# Patient Record
Sex: Male | Born: 1955 | Race: White | Hispanic: No | Smoking: Current every day smoker
Health system: Southern US, Community
[De-identification: ages and names within clinical notes are randomized; demographics above are authoritative.]

## PROBLEM LIST (undated history)

## (undated) DIAGNOSIS — C801 Malignant (primary) neoplasm, unspecified: Secondary | ICD-10-CM

## (undated) DIAGNOSIS — D494 Neoplasm of unspecified behavior of bladder: Secondary | ICD-10-CM

## (undated) DIAGNOSIS — M2550 Pain in unspecified joint: Secondary | ICD-10-CM

## (undated) DIAGNOSIS — D649 Anemia, unspecified: Secondary | ICD-10-CM

## (undated) DIAGNOSIS — IMO0002 Reserved for concepts with insufficient information to code with codable children: Secondary | ICD-10-CM

## (undated) DIAGNOSIS — D532 Scorbutic anemia: Secondary | ICD-10-CM

## (undated) DIAGNOSIS — I10 Essential (primary) hypertension: Secondary | ICD-10-CM

## (undated) DIAGNOSIS — L409 Psoriasis, unspecified: Secondary | ICD-10-CM

## (undated) DIAGNOSIS — Z5189 Encounter for other specified aftercare: Secondary | ICD-10-CM

## (undated) HISTORY — DX: Reserved for concepts with insufficient information to code with codable children: IMO0002

## (undated) HISTORY — DX: Anemia, unspecified: D64.9

## (undated) HISTORY — DX: Pain in unspecified joint: M25.50

## (undated) HISTORY — DX: Malignant (primary) neoplasm, unspecified: C80.1

## (undated) HISTORY — DX: Encounter for other specified aftercare: Z51.89

## (undated) HISTORY — DX: Neoplasm of unspecified behavior of bladder: D49.4

## (undated) HISTORY — DX: Scorbutic anemia: D53.2

## (undated) HISTORY — DX: Psoriasis, unspecified: L40.9

## (undated) HISTORY — DX: Essential (primary) hypertension: I10

---

## 2008-09-19 ENCOUNTER — Emergency Department: Payer: Self-pay | Admitting: Emergency Medicine

## 2008-10-15 ENCOUNTER — Emergency Department (HOSPITAL_COMMUNITY): Admission: EM | Admit: 2008-10-15 | Discharge: 2008-10-15 | Payer: Self-pay | Admitting: Emergency Medicine

## 2008-10-27 ENCOUNTER — Emergency Department: Payer: Self-pay | Admitting: Emergency Medicine

## 2008-11-05 ENCOUNTER — Ambulatory Visit: Payer: Self-pay | Admitting: General Practice

## 2008-11-07 ENCOUNTER — Emergency Department (HOSPITAL_COMMUNITY): Admission: EM | Admit: 2008-11-07 | Discharge: 2008-11-08 | Payer: Self-pay | Admitting: Emergency Medicine

## 2009-04-28 ENCOUNTER — Emergency Department (HOSPITAL_COMMUNITY): Admission: EM | Admit: 2009-04-28 | Discharge: 2009-04-28 | Payer: Self-pay | Admitting: Emergency Medicine

## 2009-12-10 ENCOUNTER — Emergency Department (HOSPITAL_COMMUNITY)
Admission: EM | Admit: 2009-12-10 | Discharge: 2009-12-10 | Payer: Self-pay | Source: Home / Self Care | Admitting: Family Medicine

## 2010-01-12 DIAGNOSIS — IMO0002 Reserved for concepts with insufficient information to code with codable children: Secondary | ICD-10-CM

## 2010-01-12 HISTORY — DX: Reserved for concepts with insufficient information to code with codable children: IMO0002

## 2010-01-20 ENCOUNTER — Emergency Department (HOSPITAL_COMMUNITY)
Admission: EM | Admit: 2010-01-20 | Discharge: 2010-01-20 | Disposition: A | Discharge status: Other Acute Inpatient Hospital | Payer: Self-pay | Source: Home / Self Care | Admitting: Family Medicine

## 2010-01-20 ENCOUNTER — Inpatient Hospital Stay (HOSPITAL_COMMUNITY)
Admission: EM | Admit: 2010-01-20 | Discharge: 2010-01-24 | Payer: Self-pay | Source: Home / Self Care | Attending: Internal Medicine | Admitting: Internal Medicine

## 2010-01-22 ENCOUNTER — Encounter: Payer: Self-pay | Admitting: Internal Medicine

## 2010-01-22 ENCOUNTER — Encounter (INDEPENDENT_AMBULATORY_CARE_PROVIDER_SITE_OTHER): Payer: Self-pay | Admitting: Internal Medicine

## 2010-01-26 ENCOUNTER — Encounter: Payer: Self-pay | Admitting: Internal Medicine

## 2010-01-27 LAB — TYPE AND SCREEN
ABO/RH(D): A NEG
Antibody Screen: NEGATIVE
Unit division: 0
Unit division: 0
Unit division: 0
Unit division: 0
Unit division: 0

## 2010-01-27 LAB — URINALYSIS, ROUTINE W REFLEX MICROSCOPIC
Bilirubin Urine: NEGATIVE
Hgb urine dipstick: NEGATIVE
Ketones, ur: NEGATIVE mg/dL
Nitrite: NEGATIVE
Protein, ur: NEGATIVE mg/dL
Specific Gravity, Urine: 1.013 (ref 1.005–1.030)
Urine Glucose, Fasting: NEGATIVE mg/dL
Urobilinogen, UA: 1 mg/dL (ref 0.0–1.0)
pH: 6 (ref 5.0–8.0)

## 2010-01-27 LAB — FOLATE: Folate: 11.4 ng/mL

## 2010-01-27 LAB — CBC
HCT: 17.6 % — ABNORMAL LOW (ref 39.0–52.0)
HCT: 21.4 % — ABNORMAL LOW (ref 39.0–52.0)
HCT: 28.8 % — ABNORMAL LOW (ref 39.0–52.0)
HCT: 32.1 % — ABNORMAL LOW (ref 39.0–52.0)
HCT: 35 % — ABNORMAL LOW (ref 39.0–52.0)
Hemoglobin: 4.7 g/dL — CL (ref 13.0–17.0)
Hemoglobin: 6.2 g/dL — CL (ref 13.0–17.0)
Hemoglobin: 8.6 g/dL — ABNORMAL LOW (ref 13.0–17.0)
Hemoglobin: 9.7 g/dL — ABNORMAL LOW (ref 13.0–17.0)
Hemoglobin: 10.4 g/dL — ABNORMAL LOW (ref 13.0–17.0)
MCH: 16.3 pg — ABNORMAL LOW (ref 26.0–34.0)
MCH: 19.4 pg — ABNORMAL LOW (ref 26.0–34.0)
MCH: 21 pg — ABNORMAL LOW (ref 26.0–34.0)
MCH: 21.7 pg — ABNORMAL LOW (ref 26.0–34.0)
MCH: 21.4 pg — ABNORMAL LOW (ref 26.0–34.0)
MCHC: 26.1 g/dL — ABNORMAL LOW (ref 30.0–36.0)
MCHC: 29 g/dL — ABNORMAL LOW (ref 30.0–36.0)
MCHC: 29.9 g/dL — ABNORMAL LOW (ref 30.0–36.0)
MCHC: 30.2 g/dL (ref 30.0–36.0)
MCHC: 29.7 g/dL — ABNORMAL LOW (ref 30.0–36.0)
MCV: 62.5 fL — ABNORMAL LOW (ref 78.0–100.0)
MCV: 67.1 fL — ABNORMAL LOW (ref 78.0–100.0)
MCV: 70.4 fL — ABNORMAL LOW (ref 78.0–100.0)
MCV: 71.8 fL — ABNORMAL LOW (ref 78.0–100.0)
MCV: 71.9 fL — ABNORMAL LOW (ref 78.0–100.0)
Platelets: 418 10*3/uL — ABNORMAL HIGH (ref 150–400)
Platelets: 357 10*3/uL (ref 150–400)
Platelets: 392 10*3/uL (ref 150–400)
Platelets: 351 10*3/uL (ref 150–400)
Platelets: 378 10*3/uL (ref 150–400)
RBC: 2.88 MIL/uL — ABNORMAL LOW (ref 4.22–5.81)
RBC: 3.19 MIL/uL — ABNORMAL LOW (ref 4.22–5.81)
RBC: 4.09 MIL/uL — ABNORMAL LOW (ref 4.22–5.81)
RBC: 4.47 MIL/uL (ref 4.22–5.81)
RBC: 4.87 MIL/uL (ref 4.22–5.81)
RDW: 21.1 % — ABNORMAL HIGH (ref 11.5–15.5)
RDW: 24.8 % — ABNORMAL HIGH (ref 11.5–15.5)
RDW: 25.5 % — ABNORMAL HIGH (ref 11.5–15.5)
RDW: 26.6 % — ABNORMAL HIGH (ref 11.5–15.5)
RDW: 26.7 % — ABNORMAL HIGH (ref 11.5–15.5)
WBC: 6.8 10*3/uL (ref 4.0–10.5)
WBC: 6.4 10*3/uL (ref 4.0–10.5)
WBC: 9.6 10*3/uL (ref 4.0–10.5)
WBC: 9.2 10*3/uL (ref 4.0–10.5)
WBC: 10.5 10*3/uL (ref 4.0–10.5)

## 2010-01-27 LAB — DIFFERENTIAL
Basophils Absolute: 0.1 10*3/uL (ref 0.0–0.1)
Basophils Absolute: 0.1 10*3/uL (ref 0.0–0.1)
Basophils Absolute: 0 10*3/uL (ref 0.0–0.1)
Basophils Relative: 1 % (ref 0–1)
Basophils Relative: 1 % (ref 0–1)
Basophils Relative: 0 % (ref 0–1)
Eosinophils Absolute: 0.1 10*3/uL (ref 0.0–0.7)
Eosinophils Absolute: 0.1 10*3/uL (ref 0.0–0.7)
Eosinophils Absolute: 0 10*3/uL (ref 0.0–0.7)
Eosinophils Relative: 2 % (ref 0–5)
Eosinophils Relative: 2 % (ref 0–5)
Eosinophils Relative: 0 % (ref 0–5)
Lymphocytes Relative: 24 % (ref 12–46)
Lymphocytes Relative: 31 % (ref 12–46)
Lymphocytes Relative: 7 % — ABNORMAL LOW (ref 12–46)
Lymphs Abs: 1.6 10*3/uL (ref 0.7–4.0)
Lymphs Abs: 2 10*3/uL (ref 0.7–4.0)
Lymphs Abs: 0.7 10*3/uL (ref 0.7–4.0)
Monocytes Absolute: 0.7 10*3/uL (ref 0.1–1.0)
Monocytes Absolute: 0.6 10*3/uL (ref 0.1–1.0)
Monocytes Absolute: 0.5 10*3/uL (ref 0.1–1.0)
Monocytes Relative: 11 % (ref 3–12)
Monocytes Relative: 10 % (ref 3–12)
Monocytes Relative: 5 % (ref 3–12)
Neutro Abs: 4.3 10*3/uL (ref 1.7–7.7)
Neutro Abs: 3.6 10*3/uL (ref 1.7–7.7)
Neutro Abs: 9.3 10*3/uL — ABNORMAL HIGH (ref 1.7–7.7)
Neutrophils Relative %: 62 % (ref 43–77)
Neutrophils Relative %: 56 % (ref 43–77)
Neutrophils Relative %: 88 % — ABNORMAL HIGH (ref 43–77)
nRBC: 2 /100 WBC — ABNORMAL HIGH

## 2010-01-27 LAB — POCT URINALYSIS DIPSTICK
Bilirubin Urine: NEGATIVE
Hgb urine dipstick: NEGATIVE
Ketones, ur: NEGATIVE mg/dL
Nitrite: NEGATIVE
Protein, ur: NEGATIVE mg/dL
Specific Gravity, Urine: 1.01 (ref 1.005–1.030)
Urine Glucose, Fasting: NEGATIVE mg/dL
Urobilinogen, UA: 1 mg/dL (ref 0.0–1.0)
pH: 5.5 (ref 5.0–8.0)

## 2010-01-27 LAB — POCT I-STAT, CHEM 8
BUN: 8 mg/dL (ref 6–23)
Calcium, Ion: 1.11 mmol/L — ABNORMAL LOW (ref 1.12–1.32)
Chloride: 103 mEq/L (ref 96–112)
Creatinine, Ser: 0.8 mg/dL (ref 0.4–1.5)
Glucose, Bld: 101 mg/dL — ABNORMAL HIGH (ref 70–99)
HCT: 21 % — ABNORMAL LOW (ref 39.0–52.0)
Hemoglobin: 7.1 g/dL — ABNORMAL LOW (ref 13.0–17.0)
Potassium: 4.3 mEq/L (ref 3.5–5.1)
Sodium: 139 mEq/L (ref 135–145)
TCO2: 27 mmol/L (ref 0–100)

## 2010-01-27 LAB — COMPREHENSIVE METABOLIC PANEL
ALT: 18 U/L (ref 0–53)
ALT: 17 U/L (ref 0–53)
AST: 18 U/L (ref 0–37)
AST: 16 U/L (ref 0–37)
Albumin: 3.4 g/dL — ABNORMAL LOW (ref 3.5–5.2)
Albumin: 3.7 g/dL (ref 3.5–5.2)
Alkaline Phosphatase: 61 U/L (ref 39–117)
Alkaline Phosphatase: 68 U/L (ref 39–117)
BUN: 7 mg/dL (ref 6–23)
BUN: 5 mg/dL — ABNORMAL LOW (ref 6–23)
CO2: 26 mEq/L (ref 19–32)
CO2: 28 mEq/L (ref 19–32)
Calcium: 8.7 mg/dL (ref 8.4–10.5)
Calcium: 9.3 mg/dL (ref 8.4–10.5)
Chloride: 106 mEq/L (ref 96–112)
Chloride: 102 mEq/L (ref 96–112)
Creatinine, Ser: 0.8 mg/dL (ref 0.4–1.5)
Creatinine, Ser: 0.84 mg/dL (ref 0.4–1.5)
GFR calc Af Amer: >60 mL/min (ref >60)
GFR calc Af Amer: >60 mL/min (ref >60)
GFR calc non Af Amer: >60 mL/min (ref >60)
GFR calc non Af Amer: >60 mL/min (ref >60)
Glucose, Bld: 99 mg/dL (ref 70–99)
Glucose, Bld: 161 mg/dL — ABNORMAL HIGH (ref 70–99)
Potassium: 4.6 mEq/L (ref 3.5–5.1)
Potassium: 4.8 mEq/L (ref 3.5–5.1)
Sodium: 140 mEq/L (ref 135–145)
Sodium: 140 mEq/L (ref 135–145)
Total Bilirubin: 0.8 mg/dL (ref 0.3–1.2)
Total Bilirubin: 1 mg/dL (ref 0.3–1.2)
Total Protein: 6.7 g/dL (ref 6.0–8.3)
Total Protein: 7.3 g/dL (ref 6.0–8.3)

## 2010-01-27 LAB — BASIC METABOLIC PANEL
BUN: 5 mg/dL — ABNORMAL LOW (ref 6–23)
CO2: 26 mEq/L (ref 19–32)
Calcium: 8.3 mg/dL — ABNORMAL LOW (ref 8.4–10.5)
Chloride: 104 mEq/L (ref 96–112)
Creatinine, Ser: 0.99 mg/dL (ref 0.4–1.5)
GFR calc Af Amer: >60 mL/min (ref >60)
GFR calc non Af Amer: >60 mL/min (ref >60)
Glucose, Bld: 157 mg/dL — ABNORMAL HIGH (ref 70–99)
Potassium: 4.3 mEq/L (ref 3.5–5.1)
Sodium: 136 mEq/L (ref 135–145)

## 2010-01-27 LAB — PREPARE RBC (CROSSMATCH)

## 2010-01-27 LAB — MAGNESIUM: Magnesium: 2.3 mg/dL (ref 1.5–2.5)

## 2010-01-27 LAB — VITAMIN B12: Vitamin B-12: 352 pg/mL (ref 211–911)

## 2010-01-27 LAB — PROTIME-INR
INR: 1.14 (ref 0.00–1.49)
Prothrombin Time: 14.8 seconds (ref 11.6–15.2)

## 2010-01-27 LAB — FERRITIN: Ferritin: 4 ng/mL — ABNORMAL LOW (ref 22–322)

## 2010-01-27 LAB — IRON AND TIBC
Iron: <10 ug/dL — ABNORMAL LOW (ref 42–135)
UIBC: >450 ug/dL

## 2010-01-27 LAB — URIC ACID: Uric Acid, Serum: 7.7 mg/dL (ref 4.0–7.8)

## 2010-01-27 LAB — TSH: TSH: 0.787 u[IU]/mL (ref 0.350–4.500)

## 2010-01-27 LAB — BRAIN NATRIURETIC PEPTIDE: Pro B Natriuretic peptide (BNP): 172 pg/mL — ABNORMAL HIGH (ref 0.0–100.0)

## 2010-01-27 LAB — ABO/RH: ABO/RH(D): A NEG

## 2010-01-27 LAB — APTT: aPTT: 36 seconds (ref 24–37)

## 2010-01-27 LAB — OCCULT BLOOD, POC DEVICE: Fecal Occult Bld: NEGATIVE

## 2010-02-10 ENCOUNTER — Emergency Department (HOSPITAL_COMMUNITY)
Admission: EM | Admit: 2010-02-10 | Discharge: 2010-02-10 | Payer: Self-pay | Source: Home / Self Care | Admitting: Family Medicine

## 2010-02-12 ENCOUNTER — Inpatient Hospital Stay (HOSPITAL_COMMUNITY)
Admission: RE | Admit: 2010-02-12 | Discharge: 2010-02-12 | Disposition: A | Discharge status: Home / Self Care | Payer: Self-pay | Source: Ambulatory Visit | Attending: Emergency Medicine | Admitting: Emergency Medicine

## 2010-02-13 NOTE — Letter (Signed)
Summary: Patient Notice- Polyp Results  [facility]  [address] [place], [place] [address]   Phone: [phone]  Fax: [phone]        January 26, 2010 MRN: [id number]    [facility] [address] [place], [place]  [address]    Dear Mr. FELDHAUS,  The polyp removed from your colon was adenomatous. This means that it was pre-cancerous or that  it had the potential to change into cancer over time.  I recommend that you have a repeat colonoscopy in 5 years to determine if you have developed any new polyps over time and screen for colorectal cancer. If you develop any new rectal bleeding, abdominal pain or significant bowel habit changes, please contact us before then.  In addition to repeating colonoscopy, changing health habits may reduce your risk of having more colon or rectal  polyps and possibly, colorectal cancer. You may lower your risk of future polyps and colorectal cancer by adopting healthy habits such as not smoking or using tobacco (if you do), being physically active, losing weight (if overweight), and eating a diet which includes fruits and vegetables and limits red meat.  Please call us if you are having persistent problems or have questions about your condition that have not been fully answered at this time.  Sincerely,  Iva Boop MD, [facility]  This letter has been electronically signed by your physician.  Appended Document: Patient Notice- Polyp Results letter mailed to patient's home

## 2010-02-13 NOTE — Procedures (Signed)
Summary: Upper Endoscopy  Patient: [name] Note: All result statuses are Final unless otherwise noted.  Tests: (1) Upper Endoscopy (EGD)   EGD Upper Endoscopy       DONE     [facility]     [address]     [place], [place]  [address]           ENDOSCOPY PROCEDURE REPORT           PATIENT:  [name], [name]  MR#:  [id number]     BIRTHDATE:  [birth year], 54 yrs. old  GENDER:  male           ENDOSCOPIST:  Iva Boop, MD, [facility]     Referred by:  [facility]           PROCEDURE DATE:  01/22/2010     PROCEDURE:  EGD, diagnostic 84696     ASA CLASS:  Class II     INDICATIONS:  anemia           MEDICATIONS:   Benadryl 50 mg IV, Versed 7.5 mg IV, Fentanyl 75     mcg IV     TOPICAL ANESTHETIC:  Cetacaine Spray           DESCRIPTION OF PROCEDURE:   After the risks benefits and     alternatives of the procedure were thoroughly explained, informed     consent was obtained.  The Pentax Gastroscope [id number] and     [id number] endoscope was introduced through the mouth and     advanced to the second portion of the duodenum, without     limitations.  The instrument was slowly withdrawn as the mucosa     was fully examined.     <<PROCEDUREIMAGES>>           The upper, middle, and distal third of the esophagus were     carefully inspected and no abnormalities were noted. The z-line     was well seen at the GEJ. The endoscope was pushed into the fundus     which was normal including a retroflexed view. The antrum,gastric     body, first and second part of the duodenum were unremarkable.     Retroflexed views revealed no abnormalities.    The scope was then     withdrawn from the patient and the procedure completed.           COMPLICATIONS:  None           ENDOSCOPIC IMPRESSION:     1) Normal EGD     RECOMMENDATIONS:     See colonoscopy. Use deep sedation in future when possible.           Iva Boop, MD, [facility]           CC:  [facility]           n.     eSIGNED:   Iva Boop at 01/22/2010 03:10 PM           Ned Grace, [id number]  Note: An exclamation mark (!) indicates a result that was not dispersed into the flowsheet. Document Creation Date: 01/22/2010 3:18 PM _______________________________________________________________________  (1) Order result status: Final Collection or observation date-time: 01/22/2010 14:51 Requested date-time:  Receipt date-time:  Reported date-time:  Referring Physician:   Ordering Physician: Stan Head [phone]) Specimen Source:  Source: Launa Grill Order Number: [id number] Lab site:

## 2010-02-13 NOTE — Procedures (Signed)
Summary: Colonoscopy  Patient: Josuha Fontanez Note: All result statuses are Final unless otherwise noted.  Tests: (1) Colonoscopy (COL)   COL Colonoscopy           DONE     [facility]     [address]     [place], [place]  [address]           COLONOSCOPY PROCEDURE REPORT           PATIENT:  [name], [name]  MR#:  [id number]     BIRTHDATE:  22-Mar-1955, 54 yrs. old  GENDER:  male     ENDOSCOPIST:  Iva Boop, MD, [facility]     REF. BY:  [facility],     PROCEDURE DATE:  01/22/2010     PROCEDURE:  Colonoscopy with snare polypectomy     ASA CLASS:  Class II     INDICATIONS:  Anemia microcytic, presumed iron deficient     MEDICATIONS:   There was residual sedation effect present from     prior procedure., Versed 7.5 mg IV, Fentanyl 50 mcg IV           DESCRIPTION OF PROCEDURE:   After the risks benefits and     alternatives of the procedure were thoroughly explained, informed     consent was obtained.  Digital rectal exam was performed and     revealed no abnormalities and normal prostate.   The Pentax     Colonoscope [id number] endoscope was introduced through the anus and     advanced to the cecum, which was identified by both the appendix     and ileocecal valve, limited by extreme patient discomfort at     times. Appendix seen using forceps to manipulate the cecum.   The     quality of the prep was good, using MoviPrep.  The instrument was     then slowly withdrawn as the colon was fully examined.Withdrawal:     9 minutes     <<PROCEDUREIMAGES>>           FINDINGS:  A diminutive polyp was found in the descending colon.     Polyp was snared without cautery. Retrieval was successful. snare     polyp  Moderate diverticulosis was found in the sigmoid colon.     This was otherwise a normal examination of the colon.   Retroflexed     views in the rectum revealed no abnormalities.    The scope was     then withdrawn from the patient and the procedure  completed.           COMPLICATIONS:  None     ENDOSCOPIC IMPRESSION:     1) Diminutive polyp in the descending colon - removed     2) Moderate diverticulosis in the sigmoid colon     3) Otherwise normal examination     RECOMMENDATIONS:     deep sedation in future     REPEAT EXAM:  In for Colonoscopy, pending biopsy results.           Iva Boop, MD, [facility]           n.     eSIGNED:   Iva Boop at 01/22/2010 03:17 PM           Ned Grace, [id number]  Note: An exclamation mark (!) indicates a result that was not dispersed into the flowsheet. Document Creation Date: 01/22/2010 3:18 PM _______________________________________________________________________  (1) Order result  status: Final Collection or observation date-time: 01/22/2010 15:10 Requested date-time:  Receipt date-time:  Reported date-time:  Referring Physician:   Ordering Physician: Stan Head [phone]) Specimen Source:  Source: Launa Grill Order Number: [id number] Lab site:

## 2010-03-13 HISTORY — PX: BLADDER TUMOR EXCISION: SHX238

## 2010-03-17 ENCOUNTER — Inpatient Hospital Stay (HOSPITAL_COMMUNITY): Payer: Self-pay

## 2010-03-17 ENCOUNTER — Emergency Department (HOSPITAL_COMMUNITY)
Admission: EM | Admit: 2010-03-17 | Discharge: 2010-03-17 | Disposition: A | Discharge status: Other Acute Inpatient Hospital | Payer: Self-pay | Attending: Emergency Medicine | Admitting: Emergency Medicine

## 2010-03-17 ENCOUNTER — Observation Stay (HOSPITAL_COMMUNITY)
Admission: EM | Admit: 2010-03-17 | Discharge: 2010-03-18 | Disposition: A | Discharge status: Home / Self Care | Payer: Self-pay | Source: Other Acute Inpatient Hospital | Attending: Urology | Admitting: Urology

## 2010-03-17 DIAGNOSIS — N329 Bladder disorder, unspecified: Secondary | ICD-10-CM | POA: Insufficient documentation

## 2010-03-17 DIAGNOSIS — R319 Hematuria, unspecified: Secondary | ICD-10-CM | POA: Insufficient documentation

## 2010-03-17 DIAGNOSIS — R5381 Other malaise: Secondary | ICD-10-CM | POA: Insufficient documentation

## 2010-03-17 DIAGNOSIS — I10 Essential (primary) hypertension: Secondary | ICD-10-CM | POA: Insufficient documentation

## 2010-03-17 DIAGNOSIS — K219 Gastro-esophageal reflux disease without esophagitis: Secondary | ICD-10-CM | POA: Insufficient documentation

## 2010-03-17 DIAGNOSIS — R5383 Other fatigue: Secondary | ICD-10-CM | POA: Insufficient documentation

## 2010-03-17 DIAGNOSIS — M549 Dorsalgia, unspecified: Secondary | ICD-10-CM | POA: Insufficient documentation

## 2010-03-17 DIAGNOSIS — Z862 Personal history of diseases of the blood and blood-forming organs and certain disorders involving the immune mechanism: Secondary | ICD-10-CM | POA: Insufficient documentation

## 2010-03-17 DIAGNOSIS — Z79899 Other long term (current) drug therapy: Secondary | ICD-10-CM | POA: Insufficient documentation

## 2010-03-17 DIAGNOSIS — D649 Anemia, unspecified: Secondary | ICD-10-CM | POA: Insufficient documentation

## 2010-03-17 DIAGNOSIS — G8929 Other chronic pain: Secondary | ICD-10-CM | POA: Insufficient documentation

## 2010-03-17 DIAGNOSIS — K7689 Other specified diseases of liver: Secondary | ICD-10-CM | POA: Insufficient documentation

## 2010-03-17 DIAGNOSIS — Z8639 Personal history of other endocrine, nutritional and metabolic disease: Secondary | ICD-10-CM | POA: Insufficient documentation

## 2010-03-17 LAB — URINE MICROSCOPIC-ADD ON

## 2010-03-17 LAB — DIFFERENTIAL
Basophils Absolute: 0.1 10*3/uL (ref 0.0–0.1)
Basophils Relative: 1 % (ref 0–1)
Eosinophils Absolute: 0.2 10*3/uL (ref 0.0–0.7)
Eosinophils Relative: 3 % (ref 0–5)
Lymphocytes Relative: 20 % (ref 12–46)
Lymphs Abs: 1.6 10*3/uL (ref 0.7–4.0)
Monocytes Absolute: 0.7 10*3/uL (ref 0.1–1.0)
Monocytes Relative: 8 % (ref 3–12)
Neutro Abs: 5.6 10*3/uL (ref 1.7–7.7)
Neutrophils Relative %: 68 % (ref 43–77)

## 2010-03-17 LAB — URINALYSIS, ROUTINE W REFLEX MICROSCOPIC
Bilirubin Urine: NEGATIVE
Glucose, UA: NEGATIVE mg/dL
Ketones, ur: NEGATIVE mg/dL
Nitrite: NEGATIVE
Protein, ur: 100 mg/dL — AB
Specific Gravity, Urine: 1.014 (ref 1.005–1.030)
Urobilinogen, UA: 0.2 mg/dL (ref 0.0–1.0)
pH: 5.5 (ref 5.0–8.0)

## 2010-03-17 LAB — HEMOGLOBIN AND HEMATOCRIT, BLOOD
HCT: 24.2 % — ABNORMAL LOW (ref 39.0–52.0)
Hemoglobin: 6.8 g/dL — CL (ref 13.0–17.0)

## 2010-03-17 LAB — POCT I-STAT, CHEM 8
BUN: 11 mg/dL (ref 6–23)
Calcium, Ion: 1.1 mmol/L — ABNORMAL LOW (ref 1.12–1.32)
Chloride: 97 mEq/L (ref 96–112)
Creatinine, Ser: 0.9 mg/dL (ref 0.4–1.5)
Glucose, Bld: 127 mg/dL — ABNORMAL HIGH (ref 70–99)
HCT: 29 % — ABNORMAL LOW (ref 39.0–52.0)
Hemoglobin: 9.9 g/dL — ABNORMAL LOW (ref 13.0–17.0)
Potassium: 3.6 mEq/L (ref 3.5–5.1)
Sodium: 135 mEq/L (ref 135–145)
TCO2: 28 mmol/L (ref 0–100)

## 2010-03-17 LAB — CBC
HCT: 25.8 % — ABNORMAL LOW (ref 39.0–52.0)
Hemoglobin: 7.1 g/dL — ABNORMAL LOW (ref 13.0–17.0)
MCH: 17.4 pg — ABNORMAL LOW (ref 26.0–34.0)
MCHC: 27.5 g/dL — ABNORMAL LOW (ref 30.0–36.0)
MCV: 63.1 fL — ABNORMAL LOW (ref 78.0–100.0)
Platelets: 434 10*3/uL — ABNORMAL HIGH (ref 150–400)
RBC: 4.09 MIL/uL — ABNORMAL LOW (ref 4.22–5.81)
RDW: 22.6 % — ABNORMAL HIGH (ref 11.5–15.5)
WBC: 8.2 10*3/uL (ref 4.0–10.5)

## 2010-03-17 LAB — PROTIME-INR
INR: 0.99 (ref 0.00–1.49)
Prothrombin Time: 13.3 seconds (ref 11.6–15.2)

## 2010-03-18 ENCOUNTER — Other Ambulatory Visit: Payer: Self-pay | Admitting: Urology

## 2010-03-18 ENCOUNTER — Encounter (HOSPITAL_COMMUNITY): Payer: Self-pay | Admitting: Radiology

## 2010-03-18 ENCOUNTER — Inpatient Hospital Stay (HOSPITAL_COMMUNITY): Payer: Self-pay

## 2010-03-18 LAB — CROSSMATCH
ABO/RH(D): A NEG
Antibody Screen: NEGATIVE
Unit division: 0
Unit division: 0
Unit division: 0

## 2010-03-18 LAB — HEMOGLOBIN AND HEMATOCRIT, BLOOD
HCT: 31.2 % — ABNORMAL LOW (ref 39.0–52.0)
Hemoglobin: 8.9 g/dL — ABNORMAL LOW (ref 13.0–17.0)

## 2010-03-18 LAB — PROTIME-INR
INR: 1.09 (ref 0.00–1.49)
Prothrombin Time: 14.3 seconds (ref 11.6–15.2)

## 2010-03-18 MED ORDER — IOHEXOL 300 MG/ML  SOLN
100.0000 mL | Freq: Once | INTRAMUSCULAR | Status: AC | PRN
  Administered 2010-03-18: 100 mL via INTRAVENOUS

## 2010-03-20 ENCOUNTER — Other Ambulatory Visit: Payer: Self-pay | Admitting: Urology

## 2010-03-20 ENCOUNTER — Observation Stay (HOSPITAL_COMMUNITY)
Admission: RE | Admit: 2010-03-20 | Discharge: 2010-03-21 | Disposition: A | Discharge status: Home / Self Care | Payer: Self-pay | Source: Ambulatory Visit | Attending: Urology | Admitting: Urology

## 2010-03-20 DIAGNOSIS — M109 Gout, unspecified: Secondary | ICD-10-CM | POA: Insufficient documentation

## 2010-03-20 DIAGNOSIS — F17200 Nicotine dependence, unspecified, uncomplicated: Secondary | ICD-10-CM | POA: Insufficient documentation

## 2010-03-20 DIAGNOSIS — I10 Essential (primary) hypertension: Secondary | ICD-10-CM | POA: Insufficient documentation

## 2010-03-20 DIAGNOSIS — M25429 Effusion, unspecified elbow: Secondary | ICD-10-CM | POA: Insufficient documentation

## 2010-03-20 DIAGNOSIS — Z23 Encounter for immunization: Secondary | ICD-10-CM | POA: Insufficient documentation

## 2010-03-20 DIAGNOSIS — C679 Malignant neoplasm of bladder, unspecified: Principal | ICD-10-CM | POA: Insufficient documentation

## 2010-03-20 DIAGNOSIS — K219 Gastro-esophageal reflux disease without esophagitis: Secondary | ICD-10-CM | POA: Insufficient documentation

## 2010-03-20 DIAGNOSIS — D649 Anemia, unspecified: Secondary | ICD-10-CM | POA: Insufficient documentation

## 2010-03-20 LAB — CBC
HCT: 35 % — ABNORMAL LOW (ref 39.0–52.0)
MCH: 20.3 pg — ABNORMAL LOW (ref 26.0–34.0)
MCHC: 28.3 g/dL — ABNORMAL LOW (ref 30.0–36.0)
MCV: 71.9 fL — ABNORMAL LOW (ref 78.0–100.0)
Platelets: 370 10*3/uL (ref 150–400)
RDW: 26.7 % — ABNORMAL HIGH (ref 11.5–15.5)
WBC: 12.9 10*3/uL — ABNORMAL HIGH (ref 4.0–10.5)

## 2010-03-20 LAB — BASIC METABOLIC PANEL
BUN: 9 mg/dL (ref 6–23)
Creatinine, Ser: 0.96 mg/dL (ref 0.4–1.5)
GFR calc non Af Amer: >60 mL/min (ref >60)
Glucose, Bld: 153 mg/dL — ABNORMAL HIGH (ref 70–99)
Potassium: 4 mEq/L (ref 3.5–5.1)

## 2010-03-20 LAB — SURGICAL PCR SCREEN: Staphylococcus aureus: NEGATIVE

## 2010-03-20 NOTE — H&P (Signed)
[name], Castillo NO.:  [id number]  MEDICAL RECORD NO.:  [id number]           PATIENT TYPE:  E  LOCATION:  [facility]                         FACILITY:  [facility]  PHYSICIAN:  Vania Rea, M.D. DATE OF BIRTH:  1955-08-10  DATE OF ADMISSION:  03/17/2010 DATE OF DISCHARGE:                             HISTORY & PHYSICAL   PRIMARY CARE PHYSICIAN:  Unassigned.  CHIEF COMPLAINT:  Generalized weakness.  HISTORY OF PRESENT ILLNESS:  This is a 55 year old Caucasian gentleman who has no primary care physician, but was seen in this facility 2 months ago for severe microcytic anemia.  His hemoglobin at that time was 4.7 and he was complaining of severe shortness of breath.  The patient had been visiting the urgent care repeatedly for bloody urine, but was never found to have blood in his urine on any of his visits. However on the particular visit 2 months ago, he was found to be severely anemic, was admitted to the hospitalist service and had extensive evaluation including upper and lower endoscopy and no clear cause of blood loss was found.  He was found to be severely iron- deficient, but his urinalysis at that time was negative for blood. Upper and lower endoscopy was negative for any cause of acute bleeding. He did have a CT scan of his abdomen and pelvis with contrast, which did show some abnormalities in his bladder, which was considered to be possibly artifact, but was not followed up as an outpatient.  Since discharge, the patient has been relatively healthy, but for the past 2 days as once again been having gross hematuria including passing clots and complaining of weakness.  In the emergency room, the patient was found to have a hemoglobin of 7.1 and the hospitalist service was called to assist with management.  The patient denies any chest pains or shortness of breath.  He denies any nausea or vomiting.  He denies any passage of bloody or black stool.  He says  this intermittent hematuria. There is blood in his urine.  It has been going on and off for about the past 2 years.  He does have a chronic back pains related to an injury about 15 years ago.  There is no family history of urological cancers. He does have a sister who was been treated with leukemia.  He used to be a heavy drinker, used to drink as much as 18 bottles of beer per day, but for the past 2 years has cut down considerably at the insistence of his wife and now only drinks at most one beer per week.  He used to smoke two packs per day.  He has cut down to half to one pack per day.  PAST MEDICAL HISTORY: 1. Hypertension. 2. Anemia. 3. Gout. 4. 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 5. Questionable history of acid reflux.  MEDICATIONS: 1. HCTZ 25 mg daily. 2. Metoprolol 50 mg daily. 3. Allopurinol 100 mg daily. 4. Omeprazole one daily.  ALLERGIES:  No known drug allergies.  SOCIAL HISTORY:  Tobacco and alcohol use as noted above.  He is currently living with  his second wife.  Denies any history of illicit drug use.  FAMILY HISTORY:  Significant for mother who died at age 38 from a GI malignancy.  Father who died at age 18 from unknown causes.  Two sisters, one healthy and the others treated for leukemia.  REVIEW OF SYSTEMS:  Other than noted above unremarkable.  PHYSICAL EXAM:  GENERAL:  Healthy-looking middle-aged Caucasian gentleman sitting up in the stretcher. VITAL SIGNS:  Temperature is 97.9, pulse 92, respiration 20, blood pressure 153/66.  He is saturating 100% on room air. HEENT:  His pupils are round equal.  Mucous membranes pink.  Mucous membranes pale.  Anicteric.  No cervical lymphadenopathy.  No thyromegaly or carotid bruit.  No jugular venous distention. CHEST:  Clear to auscultation bilaterally. CARDIOVASCULAR:  He has a regular rhythm.  He had a 3/6 systolic murmur over the precordium. ABDOMEN:  Obese, soft, nontender.  No masses.  No organomegaly.   No ascites. EXTREMITIES:  Without edema.  Has 2+ dorsalis pedis pulses bilaterally. SKIN:  Warm and dry.  There are no ulcerations.  He does have telangiectasis of his cheeks.  He has no spider nevi.  No stigmata of liver disease. CENTRAL NERVOUS SYSTEM:  Cranial nerves II-XII are grossly intact.  He has no focal lateralizing signs.  LABS:  His white count is 8.2, hemoglobin 7.1, MCV 63.1, platelets 433. His differential is normal.  Smear shows polychromasia and elliptocytes. His i-STAT chemistry shows a sodium of 135, potassium 3.6, chloride 97, glucose 127, BUN 11, creatinine 0.9.  His urinalysis shows cloudy urine, large amount of blood, nitrite negative, leukocyte esterase trace, protein greater than 100 in his urine is grossly bloody.  His PT and INR completely normal liver functions are not done on this visit, but 2 months ago his liver functions were completely normal.  His serum serotonin on his last visit was extremely low for no imaging studies have been done today.  ASSESSMENT: 1. Severe anemia likely due to gross and microscopic hematuria. 2. Questionable bladder tumor when reviewing CT scan of June 24, 2010. 3. Hypertension. 4. Gout. 5. Chronic back pain. 6. Questionable history of gastroesophageal reflux disease.  PLAN: 1. I had discussed this patient with Dr. Patsi Sears and I think it     would be this gentleman's best interest to have him admitted to the     urologist service and transferred to the [facility] for     further evaluation by the neurology team.  If after complete     urological evaluation, the patient is found to have no primary     urologic problems.  The patient can be referred back to internal     medicine service for further care. 2. The patient will be transfused with a total of 3 units of packed     red cells and will be started on iron supplements.  We will also     give him nicotine patch for nicotine replacement.  We will  continue     Protonix and     allopurinol. 3. We will continue HCTZ and metoprolol for his blood pressure. 4. Other plans as per orders.     Vania Rea, M.D.     LC/MEDQ  D:  03/17/2010  T:  03/17/2010  Job:  [id number]  cc:   Lynelle Smoke I. Patsi Sears, M.D.  Electronically Signed by Vania Rea M.D. on 03/20/2010 02:08:46 AM

## 2010-03-21 NOTE — Consult Note (Signed)
[name], UMHOLTZ                ACCOUNT NO.:  [id number]  MEDICAL RECORD NO.:  [id number]           PATIENT TYPE:  I  LOCATION:  1436                         FACILITY:  [facility]  PHYSICIAN:  Talmage Nap, MD  DATE OF BIRTH:  12/17/1955  DATE OF CONSULTATION:  03/21/2010 DATE OF DISCHARGE:                                CONSULTATION   CONSULTATION REQUESTED BY:  Sigmund I. Patsi Sears, MD  PRIMARY CARE PHYSICIAN:  Unassigned.  INDICATION FOR CONSULTATION:  Right elbow swelling.  HISTORY OF PRESENT ILLNESS:  The patient is a 55 year old Caucasian male with history of gout, on allopurinol, and hypertension, who initially presented to the hospital with gross hematuria with severe anemia. Subsequently, the patient had cystourethroscopy done with transurethral resection of the bladder tumor on March 20, 2010.  Postprocedure the patient was observed to have developed pain and swelling in the right elbow with associated difficulty in moving the right elbow.  He denied any associated systemic symptoms, i.e., no fever, no chills, no rigor, and swelling was said to be getting progressively worse with subsequent decreased range of movement, and I was thereafter consulted to evaluate the patient.  At the time the patient was seen by me, he denied any systemic symptoms, however, complained about intense pain at the right elbow with extreme difficulty in movement.  After evaluation, the patient was asked to take colchicine and tramadol stat doses and subsequently to continue with colchicine at home.  PAST MEDICAL HISTORY: 1. Hypertension. 2. Anemia. 3. Gout. 4. Chronic pain. 5. GERD.  PAST SURGICAL HISTORY:  Cystourethroscopy with transurethral resection of the bladder tumor secondary to gross hematuria and severe anemia.  PREADMISSION MEDICATIONS:  Include hydrochlorothiazide 25 mg p.o. daily, metoprolol 50 mg p.o. daily, allopurinol 100 mg p.o. daily, and omeprazole 1 tablet p.o.  daily.  ALLERGIES:  No known allergies.  SOCIAL HISTORY:  Positive for alcohol and  tobacco use.  The patient denies any history of illicit drug use.  FAMILY HISTORY:  York Spaniel to be positive for GI malignancy as well as leukemia.  REVIEW OF SYSTEMS:  The patient denies any history of headaches.  No blurry vision.  No nausea or vomiting.  No fever, no chills, no rigor. No chest pain or shortness of breath.  No cough.  No abdominal discomfort.  No diarrhea or hematochezia.  No dysuria or hematuria.  No swelling of the lower extremity.  He complained about pain and redness in the right elbow with difficulty in moving the right elbow.  PHYSICAL EXAMINATION:  GENERAL:  A middle-aged man, not in any acute respiratory distress, well-hydrated.  PRESENT VITAL SIGNS:  Blood pressure is 118/70, temperature is 97.9, pulse is 81, respiratory rate 20.  HEENT:  Pallor but pupils are reactive to light and extraocular muscles are intact.  NECK:  No jugular venous distention.  No carotid bruit.  No lymphadenopathy.  CHEST:  Clear to auscultation.  HEART: Sounds are 1 and 2.  ABDOMEN:  Soft, nontender.  Liver, spleen tip not palpable.  Bowel sounds are positive.  EXTREMITIES:  No pedal edema. NEUROLOGIC:  Exam is nonfocal.  MUSCULOSKELETAL:  Erythema and tenderness right elbow with difficulty, with decreased range of movement.  LABORATORY DATA:  A basic metabolic panel done on March 20, 2010, showed a sodium of 138, potassium of 4.0, chloride of 100, with a bicarb of 31, glucose is 153, BUN is 9.  Complete blood count with no differential showed WBC of 12.9, hemoglobin 9.9, hematocrit 35.0, MCV of 71.9 with a platelet count of 370.  IMPRESSION: 1.Acute gouty arthritis, right elbow.  PLAN: a. The patient to have colchicine 0.6 mg p.o. stat as well as tramadol     50 mg p.o. stat and thereafter will continue with colchicine 0.6 mg     p.o. t.i.d. at home. 2. Gross hematuria/anemia, status post  cystourethroscopy with     transurethral resection of the bladder tumor.  Management     essentially as per Urology. 3. Hypertension, presently BP stable.  The patient is to continue     metoprolol, however, because of hyperuricemia, the patient may     consider discontinuing hydrochlorothiazide. 4. Anemia:  H and H fairly stable. 5. GERD:  The patient is to continue omeprazole. 6. Discharge of the patient is essentially the prerogative of the     urologist.  Thank you for allowing me to participate in the management of this patient.     Talmage Nap, MD     CN/MEDQ  D:  03/21/2010  T:  03/21/2010  Job:  [id number]  Electronically Signed by Talmage Nap  on 03/21/2010 04:13:31 PM

## 2010-03-31 NOTE — Consult Note (Signed)
[name], [name]                ACCOUNT NO.:  [id number]  MEDICAL RECORD NO.:  [id number]           PATIENT TYPE:  I  LOCATION:  1415                         FACILITY:  [facility]  PHYSICIAN:  Meara Wiechman I. Patsi Sears, M.D.DATE OF BIRTH:  [birth year]  DATE OF CONSULTATION:  03/17/2010 DATE OF DISCHARGE:                                CONSULTATION   SUBJECTIVE:  [name] is a 55 year old divorced white male __________ [place], who is admitted today for evaluation of gross painless hematuria.  This is his third visit to [facility] with similar complaints.  The patient was here approximately 3 months ago complaining of gross hematuria, but his urinalysis was negative at that time, the patient was discharged.  He returned with acute anemia, found to be microcytic, and was treated with 4 units transfusion.  He was admitted to [facility] at that time, with GI evaluation negative.  The patient feels that he was told that he had gastric ulcers, but reports of the chart do not support that.  The patient also was found to have dyspnea thought secondary to anemia, microcytic anemia, chronic low back pain, bursitis, gout, chronic tobacco use, and hypertension.  The patient does not work, has no income, has been unable to buy any medications.  Note, his past history as noted above with anemia, gout, and hypertension.  Tobacco:  One pack a day x40 years, equals 40 pack-year history.  The patient has been given nicotine patch, but cannot afford to buy the patch.  He is not smoking in the hospital, however.  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  Currently only occasional beer (could not afford).  ALLERGIES:  None known.  MEDICATIONS:  Over-the-counter medications only.  SOCIAL HISTORY:  The patient lives with his girlfriend.  FAMILY HISTORY:  Mother died age 81 from GI malignancy.  Father died age 5 of unknown cause.  One sister with leukemia and  one brother with motor vehicle accident and chronic problems.  The patient is currently trying to become eligible for disability.  REVIEW OF SYSTEMS:  Constitutional review of systems is significant for general weakness, gross hematuria.  No frequency, urgency, dysuria, however.  No chills, fever, flank pain.  No history of stones. Remaining constitutional review of systems is noncontributory.  PHYSICAL EXAMINATION:  GENERAL:  A well-developed, well-nourished white male, in no acute distress. VITAL SIGNS:  Temperature 98.3, pulse 63, blood pressure 162/71. ABDOMEN:  Soft.  Positive bowel sounds.  No organomegaly or masses. Flank is negative for CVA pain. GENITOURINARY:  Normal penis, normal scrotum, normal testicles descended bilaterally. RECTAL:  Normal exam by recent exam. NEUROLOGIC:  Physiologic.  ASSESSMENT:  Gross hematuria with acute bleed.  The patient has been evaluated previously by internal medicine with no diagnosis made, except for microcytic anemia.  The patient is admitted by the hospitalist and transferred to Urology Service.  He will have cytologies, and will need eventual cystoscopy.  He had a very poor cystogram phase of his CT before, and needs to have contrasted CT scan of cystogram.  We will obtain  this while he is in the hospital.     Robecca Fulgham I. Patsi Sears, M.D.     SIT/MEDQ  D:  03/17/2010  T:  03/18/2010  Job:  [id number]  cc:   Dr. Orvan Falconer  Electronically Signed by Jethro Bolus M.D. on 03/31/2010 12:35:09 PM

## 2010-03-31 NOTE — Op Note (Signed)
  NAMETYRAE, ALCOSER                ACCOUNT NO.:  [id number]  MEDICAL RECORD NO.:  [id number]           PATIENT TYPE:  I  LOCATION:  1436                         FACILITY:  [facility] ([facility])  PHYSICIAN:  Tobechukwu Emmick I. Patsi Sears, M.D.DATE OF BIRTH:  [birth year]  DATE OF PROCEDURE: DATE OF DISCHARGE:                              OPERATIVE REPORT   PREOPERATIVE DIAGNOSIS:  Gross hematuria with severe anemia.  POSTOPERATIVE DIAGNOSIS:  Left bladder wall bladder cancer.  OPERATIONS:  Cystourethroscopy and TUR bladder tumor.  ANESTHESIA:  General LMA.  OPERATION:  After appropriate preanesthesia, the patient is brought to the operating room, placed on the operating table in dorsal supine position where general LMA anesthesia was introduced.  He was then replaced in the dorsal lithotomy position where the pubis was prepped with Betadine solution and draped in usual fashion.  BRIEF HISTORY:  This 55 year old male has a history of gross painless hematuria.  He had 2 prior visits to [facility] Emergency Room with gross hematuria, was found to be profoundly anemic and received 4 units of transfusion.  Complete GI and cardiac evaluation was accomplished, but no diagnosis was made.  Upon the third visit to the Emergency Room with acute anemia, he was noted to have gross painless hematuria, urology was consulted.  The patient was transfused and stabilized, allowed to be discharged, and returns today for cystoscopy.  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  CT scan shows a mass in the bladder and he is now for cystoscopy, bladder biopsies, possible TURBT.  PROCEDURE IN DETAIL:  Cystourethroscopy was accomplished and shows the patient has a normal-appearing prostate and normal urethra.  There is no bleeding of the prostate.  Inspection of bladder reveals a normal right trigone, and the left lateral wall, there is a large  bladder cancer identified, measuring approximately 4 cm.  I could not find the left ureteral orifice.  Indigo carmine was given at the beginning of the procedure, but even at the end of procedure, no blue contrast is seen from either ureter orifice.  So, I felt that the patient is possibly dehydrated, he is given extra IV fluid, but no blue contrast was identified.  Resection was accomplished of the bladder tumor that was located at the 4 o'clock position in the anterior bladder wall and is completely resected.  No bleeding was noted.  The tumor base was cauterized.  The tissue was sent to laboratory for examination.  The patient had an 18 Foley catheter placed and was clear at the end of the procedure.  Catheter drained well.  The patient was awakened after being given a B&O suppository.  He was given IV Toradol and taken to recovery room in good condition.     Kendall Arnell I. Patsi Sears, M.D.     SIT/MEDQ  D:  03/20/2010  T:  03/20/2010  Job:  [id number]  Electronically Signed by Jethro Bolus M.D. on 03/31/2010 12:35:12 PM

## 2010-04-07 ENCOUNTER — Inpatient Hospital Stay (INDEPENDENT_AMBULATORY_CARE_PROVIDER_SITE_OTHER)
Admission: RE | Admit: 2010-04-07 | Discharge: 2010-04-07 | Disposition: A | Discharge status: Home / Self Care | Payer: Self-pay | Source: Ambulatory Visit | Attending: Emergency Medicine | Admitting: Emergency Medicine

## 2010-04-07 DIAGNOSIS — M7020 Olecranon bursitis, unspecified elbow: Secondary | ICD-10-CM

## 2010-04-07 LAB — POCT I-STAT, CHEM 8
BUN: 4 mg/dL — ABNORMAL LOW (ref 6–23)
Creatinine, Ser: 0.9 mg/dL (ref 0.4–1.5)
Glucose, Bld: 106 mg/dL — ABNORMAL HIGH (ref 70–99)
Sodium: 138 mEq/L (ref 135–145)
TCO2: 26 mmol/L (ref 0–100)

## 2010-06-27 ENCOUNTER — Ambulatory Visit (INDEPENDENT_AMBULATORY_CARE_PROVIDER_SITE_OTHER): Payer: Self-pay

## 2010-06-27 ENCOUNTER — Inpatient Hospital Stay (INDEPENDENT_AMBULATORY_CARE_PROVIDER_SITE_OTHER)
Admission: RE | Admit: 2010-06-27 | Discharge: 2010-06-27 | Disposition: A | Discharge status: Home / Self Care | Payer: Self-pay | Source: Ambulatory Visit | Attending: Family Medicine | Admitting: Family Medicine

## 2010-06-27 DIAGNOSIS — M779 Enthesopathy, unspecified: Secondary | ICD-10-CM

## 2010-11-18 ENCOUNTER — Other Ambulatory Visit: Payer: Self-pay | Admitting: Urology

## 2010-11-18 ENCOUNTER — Encounter (HOSPITAL_COMMUNITY): Payer: Self-pay | Admitting: Pharmacy Technician

## 2010-11-19 ENCOUNTER — Other Ambulatory Visit: Payer: Self-pay

## 2010-11-19 ENCOUNTER — Encounter (HOSPITAL_COMMUNITY): Payer: Self-pay

## 2010-11-19 ENCOUNTER — Ambulatory Visit (HOSPITAL_COMMUNITY)
Admission: RE | Admit: 2010-11-19 | Discharge: 2010-11-19 | Disposition: A | Discharge status: Home / Self Care | Payer: Self-pay | Source: Ambulatory Visit | Attending: Urology | Admitting: Urology

## 2010-11-19 DIAGNOSIS — I10 Essential (primary) hypertension: Secondary | ICD-10-CM | POA: Insufficient documentation

## 2010-11-19 DIAGNOSIS — Z01818 Encounter for other preprocedural examination: Secondary | ICD-10-CM | POA: Insufficient documentation

## 2010-11-19 DIAGNOSIS — F17200 Nicotine dependence, unspecified, uncomplicated: Secondary | ICD-10-CM | POA: Insufficient documentation

## 2010-11-19 DIAGNOSIS — Z01812 Encounter for preprocedural laboratory examination: Secondary | ICD-10-CM | POA: Insufficient documentation

## 2010-11-19 LAB — BASIC METABOLIC PANEL
BUN: 13 mg/dL (ref 6–23)
CO2: 28 mEq/L (ref 19–32)
Calcium: 9.8 mg/dL (ref 8.4–10.5)
Chloride: 100 mEq/L (ref 96–112)
Creatinine, Ser: 0.88 mg/dL (ref 0.50–1.35)

## 2010-11-19 LAB — CBC
HCT: 42.9 % (ref 39.0–52.0)
MCH: 23.3 pg — ABNORMAL LOW (ref 26.0–34.0)
MCV: 72.3 fL — ABNORMAL LOW (ref 78.0–100.0)
RBC: 5.93 MIL/uL — ABNORMAL HIGH (ref 4.22–5.81)
WBC: 9.6 10*3/uL (ref 4.0–10.5)

## 2010-11-19 NOTE — Patient Instructions (Signed)
20 [name]  11/19/2010   Your procedure is scheduled on: 11/27/10  Report to [facility] at 6:00 AM.  Call this number if you have problems the morning of surgery: [phone]   Remember:   Do not eat food:After Midnight.  Do not drink clear liquids: After Midnight.  Take these medicines the morning of surgery with A SIP OF WATER: METOPROLOL / ALLOPUIROL   Do not wear jewelry, make-up or nail polish.  Do not wear lotions, powders, or perfumes. You may wear deodorant.  Do not shave 48 hours prior to surgery.  Do not bring valuables to the hospital.  Contacts, dentures or bridgework may not be worn into surgery.  Leave suitcase in the car. After surgery it may be brought to your room.  For patients admitted to the hospital, checkout time is 11:00 AM the day of discharge.   Patients discharged the day of surgery will not be allowed to drive home.  Name and phone number of your driver:   Special Instructions: CHG Shower Use Special Wash: 1/2 bottle night before surgery and 1/2 bottle morning of surgery.   Please read over the following fact sheets that you were given: MRSA Information

## 2010-11-27 ENCOUNTER — Encounter (HOSPITAL_COMMUNITY): Payer: Self-pay | Admitting: *Deleted

## 2010-11-27 ENCOUNTER — Encounter (HOSPITAL_COMMUNITY)
Admission: RE | Disposition: A | Discharge status: Home / Self Care | Payer: Self-pay | Source: Ambulatory Visit | Attending: Urology

## 2010-11-27 ENCOUNTER — Encounter (HOSPITAL_COMMUNITY): Payer: Self-pay | Admitting: Anesthesiology

## 2010-11-27 ENCOUNTER — Ambulatory Visit (HOSPITAL_COMMUNITY)
Admission: RE | Admit: 2010-11-27 | Discharge: 2010-11-27 | Disposition: A | Discharge status: Home / Self Care | Payer: Self-pay | Source: Ambulatory Visit | Attending: Urology | Admitting: Urology

## 2010-11-27 ENCOUNTER — Other Ambulatory Visit: Payer: Self-pay | Admitting: Urology

## 2010-11-27 DIAGNOSIS — C679 Malignant neoplasm of bladder, unspecified: Secondary | ICD-10-CM | POA: Insufficient documentation

## 2010-11-27 DIAGNOSIS — D649 Anemia, unspecified: Secondary | ICD-10-CM | POA: Insufficient documentation

## 2010-11-27 DIAGNOSIS — I10 Essential (primary) hypertension: Secondary | ICD-10-CM | POA: Insufficient documentation

## 2010-11-27 HISTORY — PX: TRANSURETHRAL RESECTION OF BLADDER TUMOR: SHX2575

## 2010-11-27 SURGERY — TURBT (TRANSURETHRAL RESECTION OF BLADDER TUMOR)
Anesthesia: General | Site: Bladder | Wound class: Clean Contaminated

## 2010-11-27 MED ORDER — LACTATED RINGERS IV SOLN: INTRAVENOUS | Status: DC

## 2010-11-27 MED ORDER — CEFAZOLIN SODIUM-DEXTROSE 2-3 GM-% IV SOLR
2.0000 g | Freq: Once | INTRAVENOUS | Status: AC
  Administered 2010-11-27: 2 g via INTRAVENOUS
  Filled 2010-11-27: qty 50

## 2010-11-27 MED ORDER — KETOROLAC TROMETHAMINE 60 MG/2ML IM SOLN: INTRAMUSCULAR | Status: DC | PRN

## 2010-11-27 MED ORDER — KETOROLAC TROMETHAMINE 30 MG/ML IJ SOLN
INTRAMUSCULAR | Status: DC | PRN
  Administered 2010-11-27: 30 mg via INTRAVENOUS

## 2010-11-27 MED ORDER — URELLE 81 MG PO TABS: 1.0000 | ORAL_TABLET | Freq: Three times a day (TID) | ORAL | Status: DC

## 2010-11-27 MED ORDER — SODIUM CHLORIDE 0.9 % IR SOLN
Status: DC | PRN
  Administered 2010-11-27: 2000 mL

## 2010-11-27 MED ORDER — FENTANYL CITRATE 0.05 MG/ML IJ SOLN
INTRAMUSCULAR | Status: DC | PRN
  Administered 2010-11-27 (×2): 50 ug via INTRAVENOUS

## 2010-11-27 MED ORDER — MIDAZOLAM HCL 5 MG/5ML IJ SOLN
INTRAMUSCULAR | Status: DC | PRN
  Administered 2010-11-27: 2 mg via INTRAVENOUS

## 2010-11-27 MED ORDER — HYDROCODONE-ACETAMINOPHEN 7.5-650 MG PO TABS: 1.0000 | ORAL_TABLET | Freq: Four times a day (QID) | ORAL | Status: AC | PRN

## 2010-11-27 MED ORDER — LIDOCAINE HCL 1 % IJ SOLN
INTRAMUSCULAR | Status: DC | PRN
  Administered 2010-11-27: 100 mg via INTRADERMAL

## 2010-11-27 MED ORDER — ONDANSETRON HCL 4 MG/2ML IJ SOLN
INTRAMUSCULAR | Status: DC | PRN
  Administered 2010-11-27: 4 mg via INTRAVENOUS

## 2010-11-27 MED ORDER — LACTATED RINGERS IV SOLN
INTRAVENOUS | Status: DC | PRN
  Administered 2010-11-27 (×2): via INTRAVENOUS

## 2010-11-27 MED ORDER — STERILE WATER FOR IRRIGATION IR SOLN
Status: DC | PRN
  Administered 2010-11-27: 550 mL

## 2010-11-27 MED ORDER — PROPOFOL 10 MG/ML IV EMUL
INTRAVENOUS | Status: DC | PRN
  Administered 2010-11-27: 200 mg via INTRAVENOUS

## 2010-11-27 MED ORDER — BELLADONNA ALKALOIDS-OPIUM 16.2-60 MG RE SUPP
RECTAL | Status: AC
  Filled 2010-11-27: qty 1

## 2010-11-27 MED ORDER — CEFAZOLIN SODIUM 1-5 GM-% IV SOLN
INTRAVENOUS | Status: AC
  Filled 2010-11-27: qty 100

## 2010-11-27 MED ORDER — BELLADONNA ALKALOIDS-OPIUM 16.2-60 MG RE SUPP
RECTAL | Status: DC | PRN
  Administered 2010-11-27: 1 via RECTAL

## 2010-11-27 SURGICAL SUPPLY — 19 items
BAG URINE DRAINAGE (UROLOGICAL SUPPLIES) IMPLANT
BAG URO CATCHER STRL LF (DRAPE) ×2 IMPLANT
CLOTH BEACON ORANGE TIMEOUT ST (SAFETY) ×2 IMPLANT
DRAPE CAMERA CLOSED 9X96 (DRAPES) ×2 IMPLANT
ELECT REM PT RETURN 9FT ADLT (ELECTROSURGICAL) ×2
ELECTRODE REM PT RTRN 9FT ADLT (ELECTROSURGICAL) ×1 IMPLANT
GLOVE BIOGEL M STRL SZ7.5 (GLOVE) ×6 IMPLANT
GOWN STRL NON-REIN LRG LVL3 (GOWN DISPOSABLE) ×2 IMPLANT
GOWN STRL REIN XL XLG (GOWN DISPOSABLE) ×2 IMPLANT
HOLDER FOLEY CATH W/STRAP (MISCELLANEOUS) IMPLANT
KIT ASPIRATION TUBING (SET/KITS/TRAYS/PACK) IMPLANT
LOOPS RESECTOSCOPE DISP (ELECTROSURGICAL) IMPLANT
MANIFOLD NEPTUNE II (INSTRUMENTS) ×2 IMPLANT
NS IRRIG 1000ML POUR BTL (IV SOLUTION) IMPLANT
PACK CYSTO (CUSTOM PROCEDURE TRAY) ×2 IMPLANT
SCRUB PCMX 4 OZ (MISCELLANEOUS) IMPLANT
SYRINGE IRR TOOMEY STRL 70CC (SYRINGE) ×2 IMPLANT
TUBING CONNECTING 10 (TUBING) ×2 IMPLANT
WATER STERILE IRR 3000ML UROMA (IV SOLUTION) ×2 IMPLANT

## 2010-11-27 NOTE — Progress Notes (Signed)
Unable to reach Dr. Patsi Sears for prn pain medication order.  Patient wants to go to pharmacy to fill Rx and will take his pain medication then.

## 2010-11-27 NOTE — Op Note (Signed)
The patient is a 55 year old male with a history of recurrent transitional cell carcinoma for resection today.  Preparation: After appropriate preanesthesia the patient is brought to the operative room placed in the upper table in dorsal supine position where general L&A anesthesia was introduced. The patient's history was reviewed, and timeout was accomplished. It is noted that the patient's chance of success for this procedures excellent.  Cystourethroscopy was accomplished, and 4 tumors were identified, on the back wall the bladder, and the bladder dome. Using the cold cup forceps, the tumors are removed, and the Bugbee electrode was used to cauterize the tumor base. There was no bleeding noted. The endoscope was a was placed at the beginning of the procedure, and the patient received IV Toradol, as well as IV antibiotic. No Foley catheter was placed. The bladder is drained of fluid, and the patient was awakened, and taken to recovery room in good condition.

## 2010-11-27 NOTE — Transfer of Care (Signed)
Immediate Anesthesia Transfer of Care Note  Patient: [name]  Procedure(s) Performed:  TRANSURETHRAL RESECTION OF BLADDER TUMOR (TURBT) - Cold Cup Bladder Biopsy of Tumor, Cauterization x 2 Lesions on Bladder Dome  Patient Location: PACU  Anesthesia Type: General  Level of Consciousness: sedated, patient cooperative and responds to stimulation  Airway & Oxygen Therapy: Patient Spontanous Breathing and Patient connected to face mask oxygen  Post-op Assessment: Report given to PACU RN, Post -op Vital signs reviewed and stable and Patient moving all extremities  Post vital signs: Reviewed and stable  Complications: No apparent anesthesia complications

## 2010-11-27 NOTE — H&P (Signed)
  [name] is an 55 y.o. male.    HPI: The patient is a 55 year old male, status post TURBT in March of 2012. 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 He has a history of one pack per day tobacco x40 years. He complains of urinary frequency and nocturia. Most recent surveillance cystoscopy shows recurrent bladder tumor, papillary in nature, measuring 0.5 cm in size. The tumor is located toward the midline, at the bladder base. Another papillary tumor was seen measuring 0.3 cm in size located on the anterior aspect of the bladder near the dome, with additional satellite lesion beside it. The patient now presents for TURBT.  Past Medical History  Diagnosis Date  . Hypertension   . Anemia   . Ulcer 01/2010  . Gout   . Joint pain   . Blood transfusion     BLEEDING ULCERS JAN 2012  . Scorbutic anemia   . Bladder tumor   . Cancer     BLADDER CANCER  . Psoriasis     Past Surgical History  Procedure Date  . Bladder tumor excision 03/2010    Medications Prior to Admission  Medication Dose Route Frequency Provider Last Rate Last Dose  . ceFAZolin (ANCEF) IVPB 2 g/50 mL premix  2 g Intravenous Once Kathi Ludwig, MD       No current outpatient prescriptions on file as of 11/27/2010.    Allergies: No Known Allergies  History reviewed. No pertinent family history.  Social History:  reports that he has been smoking Cigarettes.  He has smoked for the past .5 years. He does not have any smokeless tobacco history on file. He reports that he does not drink alcohol or use illicit drugs.  Review of Systems: Pertinent items are noted in HPI. A comprehensive review of systems was negative except for: urinary frequency, urgency, and microhematuria. No incontinence. No gross hematuria.  No results found for this or any previous visit (from the past 48 hour(s)).  No results found.  Temp:  [98 F (36.7 C)] 98 F (36.7 C) (11/15 0549) Pulse Rate:  [80] 80  (11/15 0549) Resp:   [18] 18  (11/15 0549) BP: (150)/(83) 150/83 mmHg (11/15 0549)  Physical Exam: General appearance: alert and appears stated age Head: Normocephalic, without obvious abnormality, atraumatic Eyes: conjunctivae/corneas clear. EOM's intact.  Oropharynx: moist mucous membranes Neck: supple, symmetrical, trachea midline Resp: normal respiratory effort Cardio: regular rate and rhythm Back: symmetric, no curvature. ROM normal. No CVA tenderness. GI: soft, non-tender; bowel sounds normal; no masses,  no organomegaly Male genitalia: penis: normal male phallus with no lesions or discharge.Testes: bilaterally descended with no masses or tenderness. no hernias Pelvic: deferred Extremities: extremities normal, atraumatic, no cyanosis or edema Skin: Skin color normal. No visible rashes or lesions Neurologic: Grossly normal  Assessment/Plan Recurrent bladder cancer.  Plan: TUR-BT.  Treesa Mccully I 11/27/2010, 8:25 AM

## 2010-11-27 NOTE — Anesthesia Procedure Notes (Signed)
Procedures

## 2010-11-27 NOTE — Anesthesia Postprocedure Evaluation (Signed)
  Anesthesia Post-op Note  Patient: [name]  Procedure(s) Performed:  TRANSURETHRAL RESECTION OF BLADDER TUMOR (TURBT) - Cold Cup Bladder Biopsy of Tumor, Cauterization x 2 Lesions on Bladder Dome  Patient Location: PACU  Anesthesia Type: General  Level of Consciousness: awake and alert   Airway and Oxygen Therapy: Patient Spontanous Breathing  Post-op Pain: mild  Post-op Assessment: Post-op Vital signs reviewed, Patient's Cardiovascular Status Stable, Respiratory Function Stable, Patent Airway and No signs of Nausea or vomiting  Post-op Vital Signs: stable  Complications: No apparent anesthesia complications

## 2010-11-27 NOTE — Anesthesia Preprocedure Evaluation (Addendum)
Anesthesia Evaluation  Patient identified by MRN, date of birth, ID band Patient awake    Reviewed: Allergy & Precautions, H&P , NPO status , Patient's Chart, lab work & pertinent test results  Airway Mallampati: II TM Distance: >3 FB Neck ROM: Full    Dental No notable dental hx.    Pulmonary neg pulmonary ROS,  clear to auscultation  Pulmonary exam normal       Cardiovascular hypertension, neg cardio ROS Regular Normal    Neuro/Psych Negative Neurological ROS  Negative Psych ROS   GI/Hepatic negative GI ROS, Neg liver ROS,   Endo/Other  Negative Endocrine ROS  Renal/GU negative Renal ROS  Genitourinary negative   Musculoskeletal negative musculoskeletal ROS (+)   Abdominal (+) obese,   Peds negative pediatric ROS (+)  Hematology negative hematology ROS (+)   Anesthesia Other Findings   Reproductive/Obstetrics negative OB ROS                          Anesthesia Physical Anesthesia Plan  ASA: II  Anesthesia Plan: General   Post-op Pain Management:    Induction: Intravenous  Airway Management Planned: LMA  Additional Equipment:   Intra-op Plan:   Post-operative Plan:   Informed Consent: I have reviewed the patients History and Physical, chart, labs and discussed the procedure including the risks, benefits and alternatives for the proposed anesthesia with the patient or authorized representative who has indicated his/her understanding and acceptance.   Dental advisory given  Plan Discussed with: CRNA  Anesthesia Plan Comments:         Anesthesia Quick Evaluation

## 2010-11-27 NOTE — OR Nursing (Signed)
No multiple ports, documentation error

## 2010-11-28 ENCOUNTER — Encounter (HOSPITAL_COMMUNITY): Payer: Self-pay | Admitting: Urology

## 2010-11-30 ENCOUNTER — Encounter (HOSPITAL_COMMUNITY): Payer: Self-pay

## 2010-11-30 MED ORDER — ACETAMINOPHEN 10 MG/ML IV SOLN: 1000.0000 mg | Freq: Once | INTRAVENOUS | Status: AC

## 2011-03-13 ENCOUNTER — Other Ambulatory Visit: Payer: Self-pay | Admitting: Urology

## 2011-03-13 MED ORDER — MITOMYCIN CHEMO FOR BLADDER INSTILLATION 40 MG: 40.0000 mg | Freq: Once | INTRAVENOUS | Status: AC

## 2011-03-17 ENCOUNTER — Other Ambulatory Visit (HOSPITAL_COMMUNITY): Payer: Self-pay

## 2011-03-17 ENCOUNTER — Encounter (HOSPITAL_COMMUNITY): Payer: Self-pay | Admitting: Pharmacy Technician

## 2011-03-20 ENCOUNTER — Encounter (HOSPITAL_COMMUNITY): Payer: Self-pay

## 2011-03-20 ENCOUNTER — Encounter (HOSPITAL_COMMUNITY)
Admission: RE | Admit: 2011-03-20 | Discharge: 2011-03-20 | Disposition: A | Discharge status: Home / Self Care | Payer: Self-pay | Source: Ambulatory Visit | Attending: Urology | Admitting: Urology

## 2011-03-20 LAB — CBC
Hemoglobin: 13.4 g/dL (ref 13.0–17.0)
MCHC: 34 g/dL (ref 30.0–36.0)
RDW: 15.1 % (ref 11.5–15.5)
WBC: 10.1 10*3/uL (ref 4.0–10.5)

## 2011-03-20 LAB — BASIC METABOLIC PANEL
Chloride: 101 mEq/L (ref 96–112)
GFR calc Af Amer: >90 mL/min (ref >90)
GFR calc non Af Amer: >90 mL/min (ref >90)
Glucose, Bld: 66 mg/dL — ABNORMAL LOW (ref 70–99)
Potassium: 5.1 mEq/L (ref 3.5–5.1)
Sodium: 138 mEq/L (ref 135–145)

## 2011-03-20 LAB — SURGICAL PCR SCREEN
MRSA, PCR: NEGATIVE
Staphylococcus aureus: NEGATIVE

## 2011-03-20 NOTE — Pre-Procedure Instructions (Signed)
Chest 2 view xray 11-19-10 epic ekg 11-19-10 epic

## 2011-03-20 NOTE — Patient Instructions (Signed)
20 Atwood Adcock  03/20/2011   Your procedure is scheduled on:  03-27-11  Report to [facility] at 0715  AM.  Call this number if you have problems the morning of surgery: [phone]   Remember:   Do not eat food or drink liquids:After Midnight.  .  Take these medicines the morning of surgery with A SIP OF WATER: metoprolol   Do not wear jewelry or make up.  Do not wear lotions, powders, or perfumes.Do not wear deodorant.    Do not bring valuables to the hospital.  Contacts, dentures or bridgework may not be worn into surgery.  Leave suitcase in the car. After surgery it may be brought to your room.  For patients admitted to the hospital, checkout time is 11:00 AM the day of discharge.     Special Instructions: CHG Shower Use Special Wash: 1/2 bottle night before surgery and 1/2 bottle morning of surgery.neck down avoid private area   Please read over the following fact sheets that you were given: MRSA Information  [facility] pre op nurse phone number [phone], call if needed

## 2011-03-27 ENCOUNTER — Encounter (HOSPITAL_COMMUNITY): Payer: Self-pay | Admitting: *Deleted

## 2011-03-27 ENCOUNTER — Ambulatory Visit (HOSPITAL_COMMUNITY): Payer: Self-pay | Admitting: Anesthesiology

## 2011-03-27 ENCOUNTER — Encounter (HOSPITAL_COMMUNITY)
Admission: RE | Disposition: A | Discharge status: Home / Self Care | Payer: Self-pay | Source: Ambulatory Visit | Attending: Urology

## 2011-03-27 ENCOUNTER — Encounter (HOSPITAL_COMMUNITY): Payer: Self-pay | Admitting: Anesthesiology

## 2011-03-27 ENCOUNTER — Ambulatory Visit (HOSPITAL_COMMUNITY)
Admission: RE | Admit: 2011-03-27 | Discharge: 2011-03-27 | Disposition: A | Discharge status: Home / Self Care | Payer: Self-pay | Source: Ambulatory Visit | Attending: Urology | Admitting: Urology

## 2011-03-27 DIAGNOSIS — Z79899 Other long term (current) drug therapy: Secondary | ICD-10-CM | POA: Insufficient documentation

## 2011-03-27 DIAGNOSIS — C679 Malignant neoplasm of bladder, unspecified: Secondary | ICD-10-CM

## 2011-03-27 DIAGNOSIS — M109 Gout, unspecified: Secondary | ICD-10-CM | POA: Insufficient documentation

## 2011-03-27 DIAGNOSIS — C670 Malignant neoplasm of trigone of bladder: Secondary | ICD-10-CM | POA: Insufficient documentation

## 2011-03-27 DIAGNOSIS — K219 Gastro-esophageal reflux disease without esophagitis: Secondary | ICD-10-CM | POA: Insufficient documentation

## 2011-03-27 DIAGNOSIS — I10 Essential (primary) hypertension: Secondary | ICD-10-CM | POA: Insufficient documentation

## 2011-03-27 DIAGNOSIS — R310 Gross hematuria: Secondary | ICD-10-CM | POA: Insufficient documentation

## 2011-03-27 DIAGNOSIS — Z01812 Encounter for preprocedural laboratory examination: Secondary | ICD-10-CM | POA: Insufficient documentation

## 2011-03-27 HISTORY — PX: TRANSURETHRAL RESECTION OF BLADDER TUMOR: SHX2575

## 2011-03-27 SURGERY — TURBT (TRANSURETHRAL RESECTION OF BLADDER TUMOR)
Anesthesia: General | Wound class: Clean Contaminated

## 2011-03-27 MED ORDER — CEFAZOLIN SODIUM-DEXTROSE 2-3 GM-% IV SOLR
2.0000 g | Freq: Once | INTRAVENOUS | Status: AC
  Administered 2011-03-27: 2 g via INTRAVENOUS

## 2011-03-27 MED ORDER — LIDOCAINE HCL 1 % IJ SOLN
INTRAMUSCULAR | Status: DC | PRN
  Administered 2011-03-27: 100 mg via INTRADERMAL

## 2011-03-27 MED ORDER — PROPOFOL 10 MG/ML IV EMUL
INTRAVENOUS | Status: DC | PRN
  Administered 2011-03-27: 200 mg via INTRAVENOUS

## 2011-03-27 MED ORDER — ACETAMINOPHEN 10 MG/ML IV SOLN
INTRAVENOUS | Status: DC | PRN
  Administered 2011-03-27: 1000 mg via INTRAVENOUS

## 2011-03-27 MED ORDER — ACETAMINOPHEN 10 MG/ML IV SOLN
INTRAVENOUS | Status: AC
  Filled 2011-03-27: qty 100

## 2011-03-27 MED ORDER — TRAMADOL-ACETAMINOPHEN 37.5-325 MG PO TABS: 1.0000 | ORAL_TABLET | Freq: Four times a day (QID) | ORAL | Status: AC | PRN

## 2011-03-27 MED ORDER — PHENAZOPYRIDINE HCL 200 MG PO TABS: 200.0000 mg | ORAL_TABLET | Freq: Three times a day (TID) | ORAL | Status: AC | PRN

## 2011-03-27 MED ORDER — LACTATED RINGERS IV SOLN
INTRAVENOUS | Status: DC
Start: 2011-03-27 — End: 2011-03-27
  Administered 2011-03-27: 11:00:00 via INTRAVENOUS

## 2011-03-27 MED ORDER — MIDAZOLAM HCL 5 MG/5ML IJ SOLN
INTRAMUSCULAR | Status: DC | PRN
  Administered 2011-03-27: 2 mg via INTRAVENOUS

## 2011-03-27 MED ORDER — FENTANYL CITRATE 0.05 MG/ML IJ SOLN: 25.0000 ug | INTRAMUSCULAR | Status: DC | PRN

## 2011-03-27 MED ORDER — FENTANYL CITRATE 0.05 MG/ML IJ SOLN
INTRAMUSCULAR | Status: DC | PRN
  Administered 2011-03-27 (×2): 50 ug via INTRAVENOUS

## 2011-03-27 MED ORDER — CEFAZOLIN SODIUM 1-5 GM-% IV SOLN
INTRAVENOUS | Status: AC
  Filled 2011-03-27: qty 100

## 2011-03-27 MED ORDER — BELLADONNA ALKALOIDS-OPIUM 16.2-60 MG RE SUPP
RECTAL | Status: AC
  Filled 2011-03-27: qty 1

## 2011-03-27 MED ORDER — ONDANSETRON HCL 4 MG/2ML IJ SOLN
INTRAMUSCULAR | Status: DC | PRN
  Administered 2011-03-27: 4 mg via INTRAVENOUS

## 2011-03-27 MED ORDER — LACTATED RINGERS IV SOLN
INTRAVENOUS | Status: DC | PRN
  Administered 2011-03-27: 09:00:00 via INTRAVENOUS

## 2011-03-27 MED ORDER — STERILE WATER FOR IRRIGATION IR SOLN
Status: DC | PRN
  Administered 2011-03-27: 3000 mL via INTRAVESICAL

## 2011-03-27 SURGICAL SUPPLY — 25 items
BAG URINE DRAINAGE (UROLOGICAL SUPPLIES) IMPLANT
BAG URO CATCHER STRL LF (DRAPE) ×2 IMPLANT
CLOTH BEACON ORANGE TIMEOUT ST (SAFETY) ×2 IMPLANT
DEFLUX NEEDLE (NEEDLE) IMPLANT
DEFLUX SYRINGE (SYRINGE) IMPLANT
DRAPE CAMERA CLOSED 9X96 (DRAPES) ×2 IMPLANT
ELECT REM PT RETURN 9FT ADLT (ELECTROSURGICAL) ×2
ELECTRODE REM PT RTRN 9FT ADLT (ELECTROSURGICAL) ×1 IMPLANT
GLOVE BIOGEL M STRL SZ7.5 (GLOVE) ×2 IMPLANT
GOWN STRL NON-REIN LRG LVL3 (GOWN DISPOSABLE) ×2 IMPLANT
GOWN STRL REIN XL XLG (GOWN DISPOSABLE) ×2 IMPLANT
HOLDER FOLEY CATH W/STRAP (MISCELLANEOUS) IMPLANT
KIT ASPIRATION TUBING (SET/KITS/TRAYS/PACK) ×2 IMPLANT
LOOPS RESECTOSCOPE DISP (ELECTROSURGICAL) ×2 IMPLANT
MANIFOLD NEPTUNE II (INSTRUMENTS) ×2 IMPLANT
NDL SAFETY ECLIPSE 18X1.5 (NEEDLE) IMPLANT
NEEDLE HYPO 18GX1.5 SHARP (NEEDLE)
NEEDLE SPNL 22GX7 QUINCKE BK (NEEDLE) IMPLANT
NS IRRIG 1000ML POUR BTL (IV SOLUTION) ×2 IMPLANT
PACK CYSTO (CUSTOM PROCEDURE TRAY) ×2 IMPLANT
SCRUB PCMX 4 OZ (MISCELLANEOUS) ×2 IMPLANT
SYR CONTROL 10ML LL (SYRINGE) IMPLANT
SYRINGE IRR TOOMEY STRL 70CC (SYRINGE) ×2 IMPLANT
TUBING CONNECTING 10 (TUBING) ×2 IMPLANT
WATER STERILE IRR 3000ML UROMA (IV SOLUTION) ×2 IMPLANT

## 2011-03-27 NOTE — Anesthesia Preprocedure Evaluation (Addendum)
Anesthesia Evaluation  Patient identified by MRN, date of birth, ID band Patient awake    Reviewed: Allergy & Precautions, H&P , NPO status , Patient's Chart, lab work & pertinent test results, reviewed documented beta blocker date and time   Airway Mallampati: II TM Distance: >3 FB Neck ROM: full    Dental  (+) Poor Dentition, Missing and Dental Advisory Given,    Pulmonary neg pulmonary ROS,  breath sounds clear to auscultation  Pulmonary exam normal       Cardiovascular Exercise Tolerance: Good hypertension, Pt. on home beta blockers negative cardio ROS  Rhythm:regular Rate:Normal     Neuro/Psych negative neurological ROS  negative psych ROS   GI/Hepatic negative GI ROS, Neg liver ROS,   Endo/Other  negative endocrine ROS  Renal/GU negative Renal ROS  negative genitourinary   Musculoskeletal   Abdominal   Peds  Hematology negative hematology ROS (+)   Anesthesia Other Findings   Reproductive/Obstetrics negative OB ROS                          Anesthesia Physical Anesthesia Plan  ASA: II  Anesthesia Plan: General   Post-op Pain Management:    Induction: Intravenous  Airway Management Planned: LMA  Additional Equipment:   Intra-op Plan:   Post-operative Plan:   Informed Consent: I have reviewed the patients History and Physical, chart, labs and discussed the procedure including the risks, benefits and alternatives for the proposed anesthesia with the patient or authorized representative who has indicated his/her understanding and acceptance.   Dental Advisory Given  Plan Discussed with: CRNA and Surgeon  Anesthesia Plan Comments:         Anesthesia Quick Evaluation

## 2011-03-27 NOTE — H&P (Signed)
Chief Complaint  cc: Dr. Wandra Mannan, [facility], [address], [place], [place] [address]   Reason For Visit  3 mo cysto   Active Problems Problems  1. Bladder Cancer 188.9 2. Gross Hematuria 599.71  History of Present Illness        56 YO male patient returns today for 2nd 3 mo cystoscopy.  He has completed BCG treatments #6 (last one on 02/25/11).  Status post TURBT 11/27/10. Pathology: low grade papillary carcinoma. No evidence of invasion.     (1) S/P cysto/TURBT on 03/20/10.  Pathology at that time showed highgrade TCC, but non-invasive.  Patient has a hx of tobacco use of 1 pack per day x 94yrs.   Past Medical History Problems  1. History of  Anemia 285.9 2. History of  Arthritis V13.4 3. History of  Esophageal Reflux 530.81 4. History of  Gout 274.9 5. History of  Hypertension 401.9  Surgical History Problems  1. History of  Cystoscopy With Fulguration Medium Lesion (2-5cm) 2. History of  Cystoscopy With Fulguration Small Lesion (5-62mm) 3. History of  Cystoscopy With Fulguration Small Lesion (5-57mm)  Current Meds 1. Allopurinol 100 MG Oral Tablet; Therapy: (Recorded:15Mar2012) to 2. Flexeril TABS; Therapy: (Recorded:28Nov2012) to 3. Metoprolol Tartrate 50 MG Oral Tablet; Therapy: (Recorded:15Mar2012) to 4. Phenazopyridine HCl 200 MG Oral Tablet; TAKE 1 TABLET 3 x Daily AS NEEDED; Therapy:  28Nov2012 to (Evaluate:11Mar2013)  Requested for: 10Jan2013; Last Rx:10Jan2013  Allergies Medication  1. No Known Drug Allergies  Family History Problems  1. Fraternal history of  Adult Sleep Apnea 2. Maternal history of  Cancer 3. Fraternal history of  Diabetes Mellitus V18.0 4. Family history of  Family Health Status Number Of Children 1 son 5. Family history of  Father Deceased At Age ____ 30, unknown 6. Fraternal history of  Gout V18.19 7. Sororal history of  Leukemia V16.6 8. Family history of  Mother Deceased At Age ____ 56, GI  malignancy  Social History Problems  1. Alcohol Use Hx of 18 beers per day x 79yrs, but now only occasionally due to cost 2. Caffeine Use All day long 3. Marital History - Divorced V61.03 4. Occupation: unemployed 5. Tobacco Use 305.1 1 ppd x 80yrs  Review of Systems Constitutional, skin, eye, hematologic/lymphatic, cardiovascular, endocrine, musculoskeletal, gastrointestinal, neurological and psychiatric system(s) were reviewed and pertinent findings if present are noted.  Genitourinary: urinary frequency and feelings of urinary urgency.    Vitals Vital Signs [Data Includes: Last 1 Day]  27Feb2013 11:11AM  Blood Pressure: 118 / 67 Temperature: 97.8 F Heart Rate: 83  Results/Data Urine [Data Includes: Last 1 Day]   27Feb2013  COLOR ORANGE   APPEARANCE CLEAR   SPECIFIC GRAVITY 1.020   pH 5.0   GLUCOSE 250 mg/dL  BILIRUBIN NEG   KETONE TRACE mg/dL  BLOOD NEG   PROTEIN 604 mg/dL  UROBILINOGEN > 8 mg/dL  NITRITE POS   LEUKOCYTE ESTERASE TRACE   SQUAMOUS EPITHELIAL/HPF RARE   WBC 0-3 WBC/hpf  RBC 0-3 RBC/hpf  BACTERIA NONE SEEN   CRYSTALS NONE SEEN   CASTS NONE SEEN    Procedure  Procedure: Cystoscopy  Indication: History of Urothelial Carcinoma.  Informed Consent: Risks, benefits, and potential adverse events were discussed and informed consent was obtained from the patient.  Prep: The patient was prepped with betadine.  Anesthesia:. Local anesthesia was administered intraurethrally with 2% lidocaine jelly.  Antibiotic prophylaxis: Ciprofloxacin.  Procedure Note:  Urethral meatus:. No abnormalities.  Anterior  urethra: No abnormalities.  Prostatic urethra: No abnormalities.  Bladder: Visulization was clear. The ureteral orifices were in the normal anatomic position bilaterally. Examination of the bladder demonstrated no trabeculation and no diverticulum no edema and no cellules. A solitary tumor was visualized in the bladder. A papillary tumor was seen in the  bladder measuring approximately 1 cm in size. This tumor was located on the right side, on the anterior aspect, at the base of the bladder. The patient tolerated the procedure well Amended By: Jethro Bolus; 03/20/2011 5:08 PMEST  Complications: None. Amended By: Jethro Bolus; 03/20/2011 5:08 PMEST.    Assessment Assessed  1. Bladder Cancer 188.9 2. Gross Hematuria 599.71   Recurrent bladder cancer. He is still smoking. He needs TURBT and mitomycin instillation. He is counselled to stop smoking.   Plan Bladder Cancer (188.9), Gross Hematuria (599.71)  1. Follow-up Schedule Surgery Office  Follow-up  Requested for: 27Feb2013 Health Maintenance (V70.0)  2. UA With REFLEX  Done: 27Feb2013 10:57AM   Pt to see Dr. Angela Adam re: smoking cessation.   Signatures Electronically signed by : Jethro Bolus, M.D.; Mar 20 2011  5:08PM

## 2011-03-27 NOTE — Preoperative (Signed)
Beta Blockers   Reason not to administer Beta Blockers:Not Applicable 

## 2011-03-27 NOTE — Anesthesia Postprocedure Evaluation (Signed)
  Anesthesia Post-op Note  Patient: [name]  Procedure(s) Performed: Procedure(s) (LRB): TRANSURETHRAL RESECTION OF BLADDER TUMOR (TURBT) (N/A)  Patient Location: PACU  Anesthesia Type: General  Level of Consciousness: awake and alert   Airway and Oxygen Therapy: Patient Spontanous Breathing  Post-op Pain: mild  Post-op Assessment: Post-op Vital signs reviewed, Patient's Cardiovascular Status Stable, Respiratory Function Stable, Patent Airway and No signs of Nausea or vomiting  Post-op Vital Signs: stable  Complications: No apparent anesthesia complications

## 2011-03-27 NOTE — Addendum Note (Signed)
Addendum  created 03/27/11 1101 by Gaetano Hawthorne, MD   Modules edited:Notes Section

## 2011-03-27 NOTE — Anesthesia Postprocedure Evaluation (Addendum)
  Anesthesia Post-op Note  Patient: [name]  Procedure(s) Performed: Procedure(s) (LRB): TRANSURETHRAL RESECTION OF BLADDER TUMOR (TURBT) (N/A)  Patient Location: PACU  Anesthesia Type: General  Level of Consciousness: awake and alert   Airway and Oxygen Therapy: Patient Spontanous Breathing  Post-op Pain: mild  Post-op Assessment: Post-op Vital signs reviewed, Patient's Cardiovascular Status Stable, Respiratory Function Stable, Patent Airway and No signs of Nausea or vomiting  Post-op Vital Signs: stable  Complications: No apparent anesthesia complications 

## 2011-03-27 NOTE — Discharge Instructions (Signed)
Bladder Cancer Bladder cancer is an abnormal growth of tissue in your bladder. Your bladder is the balloon-like sack in your pelvis. It collects and stores urine that comes from the kidneys through the ureters. The ureters are 2 small muscular tubes that carry the urine from the kidneys to the bladder. When you feel the urge to pass your water (urinate), the urine in the bladder is passed out through the urethra. The bladder wall is made of layers. If cancer spreads into these layers and through the wall of the bladder, it becomes more difficult to treat.  CAUSES  The exact cause of bladder cancer is not known. There is a number of risk factors that can increase your chances of getting bladder cancer. They include:  Smoking. Smokers are more than twice as likely to get bladder cancer as nonsmokers.   Occupational exposures. Workers in a number of industries are at higher risk of bladder cancer. These include:   Rubber, Administrator, textile, and paint production workers.   Painters, hairdressers, Engineer, building services, Journalist, newspaper, and truck drivers.   The combination of industrial exposure and smoking further increases risk.   Race. Whites are about twice as likely to develop bladder cancer than African Americans and Hispanics. Asians have the lowest risk of bladder cancer.   Age. The risk of bladder cancer increases with age. Over half of the people with bladder cancer are over 43 years of age.   Sex. Men are 4 times more likely than women to get bladder cancer.   Chronic bladder inflammation. Urinary infections, kidney, and bladder stones and other causes of chronic bladder irritation are linked with bladder cancer. They do not necessarily cause bladder cancer.   Bladder cancer history. Once you have a bladder cancer, you are at higher risk of having another bladder cancer (even if the first cancer was completely treated).   Passed down by parents (heredity). Some bladder cancers seem to be inherited.    Chemotherapy and radiation therapy:   Cytoprotective medications may be used to protect the bladder from irritation and reduce the risk of bladder cancer.   People who receive radiation treatment to the pelvis are more likely to develop bladder cancer.   Arsenic. Arsenic in drinking water has been associated with an increased risk of bladder cancer.  In certain parts of the world (not the [place] a parasite called schistosomiasis is strongly associated with bladder cancer. This form of bladder cancer is not discussed here.  SYMPTOMS   Blood in the urine (hematuria) is the main symptom of bladder cancer. Blood may be present only in small amounts detectable under a microscope, or may cause the urine to actually appear bloody.   Irritation with urination.   Frequent bladder or urine infections.  DIAGNOSIS  Your caregiver may suspect bladder cancer based on your description of urinary symptomsor based on the finding of blood or infection in the urine (especially if this has recurred several times). A specialist will perform a test by inserting a narrow flexible or rigid tube into your bladder through your penis or urethra (cystoscopy). They will use light anaesthesia during this test. This test searches the lining of your bladder for tumors. A biopsy will be done to sample the tumor. The sample will be examined under a microscope to see if cancer is present.  If a cancer is present, it will then be staged to determine its severity and extent. It is important to know how deeply into the bladder wall the cancer has  grown, and whether the cancer has spread to any other parts of your body. Staging may require blood tests or special scans. TREATMENT  Once your cancer has been diagnosed and staged, you should discuss a treatment plan with your caregiver. Based on the stage of the cancer, one treatment or a combination of treatments may be recommended. The most common forms of treatment are:  Surgery.    Radiation Therapy.   Chemotherapy.   Immunotherapy.  When bladder cancer is caught early, the cancer can often be removed via cystoscopy, and no other treatment is necessary. Ongoing monitoring for recurrence is necessary.  HOME CARE INSTRUCTIONS   Take any prescription medications exactly as directed.   Keep all testing and specialist follow-up appointments.   If you have had a cystoscopy, you may have some blood in your urine after the test. This should clear up. You may also have some irritation with urination. If you were prescribed an antibiotic or pain medication, take it as directed.  SEEK MEDICAL CARE IF:   You develop increased pain, painful urination, or fever after a cystoscopy.   Your urine becomes bloody after initially improving.  SEEK IMMEDIATE MEDICAL CARE IF:  You develop a fever with chills with or without back or flank pain.  Document Released: 01/01/2003 Document Revised: 12/18/2010 Document Reviewed: 08/12/2007 [facility] Patient Information 2012 [place], [place].Transurethral Resection, Bladder Tumor A cancerous growth (tumor) can develop on the inside wall of the bladder. The bladder is the organ that holds urine. One way to remove the tumor is a procedure called a transurethral resection. The tumor is removed (resected) through the tube that carries urine from the bladder out of the body (urethra). No cuts (incisions) are made in the skin. Instead, the procedure is done through a thin telescope, called a resectoscope. Attached to it is a light and usually a tiny camera. The resectoscope is put into the urethra. In men, the urethra opens at the end of the penis. In women, it opens just above the vagina.  A transurethral resection is usually used to remove tumors that have not gotten too big or too deep. These are called Stage 0, Stage 1 or Stage 2 bladder cancers. LET YOUR CAREGIVER KNOW ABOUT:  On the day of the procedure, your caregivers will need to know the last  time you had anything to eat or drink. This includes water, gum, and candy. In advance, make sure they know about:   Any allergies.   All medications you are taking, including:   Herbs, eyedrops, over-the-counter medications and creams.   Blood thinners (anticoagulants), aspirin or other drugs that could affect blood clotting.   Use of steroids (by mouth or as creams).   Previous problems with anesthetics, including local anesthetics.   Possibility of pregnancy, if this applies.   Any history of blood clots.   Any history of bleeding or other blood problems.   Previous surgery.   Smoking history.   Any recent symptoms of colds or infections.   Other health problems.  RISKS AND COMPLICATIONS This is usually a safe procedure. Every procedure has risks, though. For a transurethral resection, they include:  Infection. Antibiotic medication would need to be taken.   Bleeding.   Light bleeding may last for several days after the procedure.   If bleeding continues or is heavy, the bladder may need rinsing. Or, a new catheter might be put in for awhile.   Sometimes bed rest is needed.   Urination problems.  Pain and burning can occur when urinating. This usually goes away in a few days.   Scarring from the procedure can block the flow of urine.   Bladder damage.   It can be punctured or torn during removal of the tumor. If this happens, a catheter might be needed for longer. Antibiotics would be taken while the bladder heals.   Urine can leak through the hole or tear into the abdomen. If this happens, surgery may be needed to repair the bladder.  BEFORE THE PROCEDURE   A medical evaluation will be done. This may include:   A physical examination.   Urine test. This is to make sure you do not have a urinary tract infection.   Blood tests.   A test that checks the heart's rhythm (electrocardiogram).   Talking with an anesthesiologist. This is the person who will  be in charge of the medication (anesthesia) to keep you from feeling pain during the transurethral resection. You might be asleep during the procedure (general anesthesia) or numb from the waist down, but awake during the procedure (spinal anesthesia). Ask your surgeon what to expect.   The person who is having a transurethral resection needs to give what is called informed consent. This requires signing a legal paper that gives permission for the procedure. To give informed consent:   You must understand how the procedure is done and why.   You must be told all the risks and benefits of the procedure.   You must sign the consent. Sometimes a legal guardian can do this.   Signing should be witnessed by a healthcare professional.   The day before the surgery, eat only a light dinner. Then, do not eat or drink anything for at least 8 hours before the surgery. Ask your caregiver if it is OK to take any needed medicines with a sip of water.   Arrive at least an hour before the surgery or whenever your surgeon recommends. This will give you time to check in and fill out any needed paperwork.  PROCEDURE  The preparation:   You will change into a hospital gown.   A needle will be inserted in your arm. This is an intravenous access tube (IV). Medication will be able to flow directly into your body through this needle.   Small monitors will be put on your body. They are used to check your heart, blood pressure, and oxygen level.   You might be given medication that will help you relax (sedative).   You will be given a general anesthetic or spinal anesthesia.   The procedure:   Once you are asleep or numb from the waist down, your legs will be placed in stirrups.   The resectoscope will be passed through the urethra into the bladder.   Fluid will be passed through the resectoscope. This will fill the bladder with water.   The surgeon will examine the bladder through the scope. If the scope  has a camera, it can take pictures from inside the bladder. They can be projected onto a TV screen.   The surgeon will use various tools to remove the tumor in small pieces. Sometimes a laser (a beam of light energy) is used. Other tools may use electric current.   A tube (catheter) will often be placed so that urine can drain into a bag outside the body. This process helps stop bleeding. This tube keeps blood clots from blocking the urethra.   The procedure usually takes  30 to 45 minutes.  AFTER THE PROCEDURE   You will stay in a recovery area until the anesthesia has worn off. Your blood pressure and pulse will be checked every so often. Then you will be taken to a hospital room.   You may continue to get fluids through the IV for awhile.   Some pain is normal. The catheter might be uncomfortable. Pain is usually not severe. If it is, ask for pain medicine.   Your urine may look bloody after a transurethral resection. This is normal.   If bleeding is heavy, a hospital caregiver may rinse out the bladder (irrigation) through the catheter.   Once the urine is clear, the catheter will be taken out.   You will need to stay in the hospital until you can urinate on your own.   Most people stay in the hospital for up to 4 days.  PROGNOSIS   Transurethral resection is considered the best way to treat bladder tumors that are not too far along. For most people, the treatment is successful. Sometimes, though, more treatment is needed.   Bladder cancers can come back even after a successful procedure. Because of this, be sure to have a checkup with your caregiver every 3 to 6 months. If everything is OK for 3 years, you can reduce the checkups to once a year.  Document Released: 10/25/2008 Document Revised: 12/18/2010 Document Reviewed: 10/25/2008 [facility] Patient Information 2012 [place], [place].

## 2011-03-27 NOTE — Addendum Note (Signed)
Addendum  created 03/27/11 1102 by Gaetano Hawthorne, MD   Modules edited:Notes Section

## 2011-03-27 NOTE — Interval H&P Note (Signed)
History and Physical Interval Note:  03/27/2011 9:57 AM  [name]  has presented today for surgery, with the diagnosis of bladder tumor  The various methods of treatment have been discussed with the patient and family. After consideration of risks, benefits and other options for treatment, the patient has consented to  Procedure(s) (LRB): TRANSURETHRAL RESECTION OF BLADDER TUMOR (TURBT) (N/A) CYSTOSCOPY WITH INJECTION (N/A) as a surgical intervention .  The patients' history has been reviewed, patient examined, no change in status, stable for surgery.  I have reviewed the patients' chart and labs.  Questions were answered to the patient's satisfaction.     Jethro Bolus I

## 2011-03-27 NOTE — Op Note (Signed)
Pre-operative diagnosis : Recurrent TCC bladder  Postoperative diagnosis:Same Operation: Excision of recurrent R sided TCC bladder and cauterization of the tumor base.  Surgeon:  Kathie Rhodes. Patsi Sears, MD  First assistant:None  Anesthesia:  Gen LMA  Preparation: After appropriate pre-anesthesia, the pt was brought to the OR and placed on the OR table in the supine position, where general LMA anesthesia was introduced. Arm band checked and TIME-OUT observed.   Review history: Solitary recurrence of TCC, R lateral trigone, in 56 yo smoker.   Statement of  Likelihood of Success: Excellent. TIME-OUT observed.:  Procedure:  Cysto accomplished and shows normal glans and penile external shaft. Urethra was normal in both pendulous and proximal portions. Prostate is 2+ benign, and bladder neck normal. Bladder is inspected and has normal mucosa, except as noted below, without trabeculation or cellules. No bladder stone or diverticula noted. A solitary 1 cm bladder tumor is noted on the R trigone, and is removed with the cold cup biopsy forceps, with cauterization of the base. Clear efflux was seen from both orifices. The tumor was > 1cm lateral to the R orifice.

## 2011-03-27 NOTE — Transfer of Care (Signed)
Immediate Anesthesia Transfer of Care Note  Patient: [name]  Procedure(s) Performed: Procedure(s) (LRB): TRANSURETHRAL RESECTION OF BLADDER TUMOR (TURBT) (N/A)  Patient Location: PACU  Anesthesia Type: General  Level of Consciousness: awake, alert , oriented and patient cooperative  Airway & Oxygen Therapy: Patient Spontanous Breathing and Patient connected to face mask oxygen  Post-op Assessment: Report given to PACU RN, Post -op Vital signs reviewed and stable and Patient moving all extremities  Post vital signs: Reviewed and stable  Complications: No apparent anesthesia complications

## 2011-04-08 ENCOUNTER — Encounter (HOSPITAL_COMMUNITY): Payer: Self-pay | Admitting: Urology

## 2012-05-03 ENCOUNTER — Inpatient Hospital Stay: Payer: Self-pay | Admitting: Internal Medicine

## 2012-05-03 LAB — SALICYLATE LEVEL: Salicylates, Serum: 4.3 mg/dL — ABNORMAL HIGH

## 2012-05-03 LAB — TROPONIN I: Troponin-I: 0.23 ng/mL — ABNORMAL HIGH

## 2012-05-03 LAB — COMPREHENSIVE METABOLIC PANEL
Albumin: 3.3 g/dL — ABNORMAL LOW (ref 3.4–5.0)
Anion Gap: 9 (ref 7–16)
Chloride: 108 mmol/L — ABNORMAL HIGH (ref 98–107)
Co2: 23 mmol/L (ref 21–32)
Creatinine: 1.13 mg/dL (ref 0.60–1.30)
EGFR (African American): >60
EGFR (Non-African Amer.): >60
Osmolality: 283 (ref 275–301)
Potassium: 3.5 mmol/L (ref 3.5–5.1)
SGPT (ALT): 29 U/L (ref 12–78)
Sodium: 140 mmol/L (ref 136–145)

## 2012-05-03 LAB — URINALYSIS, COMPLETE
Bacteria: NONE SEEN
Bilirubin,UR: NEGATIVE
Glucose,UR: NEGATIVE mg/dL (ref 0–75)
Ketone: NEGATIVE
Nitrite: NEGATIVE
RBC,UR: 1 /HPF (ref 0–5)
WBC UR: 1 /HPF (ref 0–5)

## 2012-05-03 LAB — DRUG SCREEN, URINE
Amphetamines, Ur Screen: NEGATIVE (ref ?–1000)
Barbiturates, Ur Screen: NEGATIVE (ref ?–200)
Benzodiazepine, Ur Scrn: NEGATIVE (ref ?–200)
Cocaine Metabolite,Ur ~~LOC~~: NEGATIVE (ref ?–300)
Methadone, Ur Screen: NEGATIVE (ref ?–300)

## 2012-05-03 LAB — CSF CC+PROT+GLU+CULT PANEL
CSF Tube #: 3
Lymphocytes: 44 %
Neutrophils: 0 %
Other Cells: 0 %
Protein, CSF: 73 mg/dL — ABNORMAL HIGH (ref 15–45)

## 2012-05-03 LAB — CBC
MCH: 29.7 pg (ref 26.0–34.0)
MCHC: 33.4 g/dL (ref 32.0–36.0)
RBC: 5.36 10*6/uL (ref 4.40–5.90)

## 2012-05-03 LAB — CK TOTAL AND CKMB (NOT AT ARMC)
CK, Total: 68 U/L (ref 35–232)
CK-MB: 1.2 ng/mL (ref 0.5–3.6)

## 2012-05-03 LAB — PROTIME-INR: Prothrombin Time: 13.2 secs (ref 11.5–14.7)

## 2012-05-03 LAB — MAGNESIUM: Magnesium: 1.3 mg/dL — ABNORMAL LOW

## 2012-05-04 ENCOUNTER — Ambulatory Visit: Payer: Self-pay | Admitting: Neurology

## 2012-05-04 DIAGNOSIS — I517 Cardiomegaly: Secondary | ICD-10-CM

## 2012-05-04 LAB — CK TOTAL AND CKMB (NOT AT ARMC)
CK, Total: 119 U/L (ref 35–232)
CK-MB: 1.6 ng/mL (ref 0.5–3.6)

## 2012-05-04 LAB — BASIC METABOLIC PANEL
Anion Gap: 3 — ABNORMAL LOW (ref 7–16)
BUN: 9 mg/dL (ref 7–18)
Calcium, Total: 7.8 mg/dL — ABNORMAL LOW (ref 8.5–10.1)
Chloride: 111 mmol/L — ABNORMAL HIGH (ref 98–107)
Co2: 27 mmol/L (ref 21–32)
EGFR (African American): >60
EGFR (Non-African Amer.): >60
Osmolality: 280 (ref 275–301)

## 2012-05-04 LAB — CBC WITH DIFFERENTIAL/PLATELET
Lymphocyte #: 1.5 10*3/uL (ref 1.0–3.6)
Lymphocyte %: 17.3 %
Monocyte #: 0.7 x10 3/mm (ref 0.2–1.0)
Monocyte %: 8 %
Platelet: 238 10*3/uL (ref 150–440)
RDW: 14 % (ref 11.5–14.5)
WBC: 8.6 10*3/uL (ref 3.8–10.6)

## 2012-05-05 LAB — ALBUMIN: Albumin: 2.8 g/dL — ABNORMAL LOW (ref 3.4–5.0)

## 2012-05-09 LAB — CULTURE, BLOOD (SINGLE)

## 2013-03-12 ENCOUNTER — Inpatient Hospital Stay: Payer: Self-pay | Admitting: Internal Medicine

## 2013-03-12 LAB — CBC WITH DIFFERENTIAL/PLATELET
BASOS ABS: 0.1 10*3/uL (ref 0.0–0.1)
Basophil %: 0.7 %
EOS PCT: 3.8 %
Eosinophil #: 0.4 10*3/uL (ref 0.0–0.7)
HCT: 38.2 % — AB (ref 40.0–52.0)
HGB: 12.7 g/dL — AB (ref 13.0–18.0)
LYMPHS PCT: 19.6 %
Lymphocyte #: 1.9 10*3/uL (ref 1.0–3.6)
MCH: 28.9 pg (ref 26.0–34.0)
MCHC: 33.2 g/dL (ref 32.0–36.0)
MCV: 87 fL (ref 80–100)
MONO ABS: 0.7 x10 3/mm (ref 0.2–1.0)
MONOS PCT: 7.2 %
NEUTROS ABS: 6.7 10*3/uL — AB (ref 1.4–6.5)
Neutrophil %: 68.7 %
PLATELETS: 216 10*3/uL (ref 150–440)
RBC: 4.38 10*6/uL — ABNORMAL LOW (ref 4.40–5.90)
RDW: 15.6 % — ABNORMAL HIGH (ref 11.5–14.5)
WBC: 9.7 10*3/uL (ref 3.8–10.6)

## 2013-03-12 LAB — COMPREHENSIVE METABOLIC PANEL
ALBUMIN: 3.6 g/dL (ref 3.4–5.0)
ALK PHOS: 88 U/L
ANION GAP: 4 — AB (ref 7–16)
BUN: 25 mg/dL — AB (ref 7–18)
Bilirubin,Total: 0.4 mg/dL (ref 0.2–1.0)
CALCIUM: 8.6 mg/dL (ref 8.5–10.1)
CO2: 30 mmol/L (ref 21–32)
Chloride: 103 mmol/L (ref 98–107)
Creatinine: 2.12 mg/dL — ABNORMAL HIGH (ref 0.60–1.30)
GFR CALC AF AMER: 39 — AB
GFR CALC NON AF AMER: 34 — AB
GLUCOSE: 127 mg/dL — AB (ref 65–99)
OSMOLALITY: 280 (ref 275–301)
POTASSIUM: 5.6 mmol/L — AB (ref 3.5–5.1)
SGOT(AST): 29 U/L (ref 15–37)
SGPT (ALT): 36 U/L (ref 12–78)
Sodium: 137 mmol/L (ref 136–145)
TOTAL PROTEIN: 7.8 g/dL (ref 6.4–8.2)

## 2013-03-12 LAB — ETHANOL
Ethanol %: <0.003 % (ref 0.000–0.080)
Ethanol: <3 mg/dL

## 2013-03-12 LAB — AMMONIA: AMMONIA, PLASMA: 60 umol/L — AB (ref 11–32)

## 2013-03-12 LAB — TROPONIN I: Troponin-I: <0.02 ng/mL

## 2013-03-13 LAB — URINALYSIS, COMPLETE
BACTERIA: NONE SEEN
BILIRUBIN, UR: NEGATIVE
BLOOD: NEGATIVE
Glucose,UR: NEGATIVE mg/dL (ref 0–75)
Hyaline Cast: 13
KETONE: NEGATIVE
Leukocyte Esterase: NEGATIVE
Nitrite: NEGATIVE
PH: 5 (ref 4.5–8.0)
PROTEIN: NEGATIVE
RBC, UR: NONE SEEN /HPF (ref 0–5)
Specific Gravity: 1.013 (ref 1.003–1.030)
WBC UR: <1 /HPF (ref 0–5)

## 2013-03-13 LAB — DRUG SCREEN, URINE
AMPHETAMINES, UR SCREEN: NEGATIVE (ref ?–1000)
BENZODIAZEPINE, UR SCRN: POSITIVE (ref ?–200)
Barbiturates, Ur Screen: NEGATIVE (ref ?–200)
Cannabinoid 50 Ng, Ur ~~LOC~~: NEGATIVE (ref ?–50)
Cocaine Metabolite,Ur ~~LOC~~: NEGATIVE (ref ?–300)
MDMA (Ecstasy)Ur Screen: NEGATIVE (ref ?–500)
METHADONE, UR SCREEN: POSITIVE (ref ?–300)
OPIATE, UR SCREEN: NEGATIVE (ref ?–300)
PHENCYCLIDINE (PCP) UR S: NEGATIVE (ref ?–25)
Tricyclic, Ur Screen: NEGATIVE (ref ?–1000)

## 2013-03-14 LAB — CBC WITH DIFFERENTIAL/PLATELET
BASOS PCT: 1.1 %
Basophil #: 0.1 10*3/uL (ref 0.0–0.1)
Eosinophil #: 0.5 10*3/uL (ref 0.0–0.7)
Eosinophil %: 5.9 %
HCT: 39.6 % — ABNORMAL LOW (ref 40.0–52.0)
HGB: 13.6 g/dL (ref 13.0–18.0)
LYMPHS ABS: 1.2 10*3/uL (ref 1.0–3.6)
Lymphocyte %: 13.9 %
MCH: 29.9 pg (ref 26.0–34.0)
MCHC: 34.3 g/dL (ref 32.0–36.0)
MCV: 87 fL (ref 80–100)
Monocyte #: 0.8 x10 3/mm (ref 0.2–1.0)
Monocyte %: 9.4 %
NEUTROS ABS: 5.9 10*3/uL (ref 1.4–6.5)
NEUTROS PCT: 69.7 %
Platelet: 190 10*3/uL (ref 150–440)
RBC: 4.56 10*6/uL (ref 4.40–5.90)
RDW: 15.2 % — AB (ref 11.5–14.5)
WBC: 8.4 10*3/uL (ref 3.8–10.6)

## 2013-03-14 LAB — BASIC METABOLIC PANEL
Anion Gap: 3 — ABNORMAL LOW (ref 7–16)
BUN: 9 mg/dL (ref 7–18)
CALCIUM: 8.2 mg/dL — AB (ref 8.5–10.1)
CO2: 29 mmol/L (ref 21–32)
CREATININE: 0.91 mg/dL (ref 0.60–1.30)
Chloride: 105 mmol/L (ref 98–107)
EGFR (African American): >60
GLUCOSE: 123 mg/dL — AB (ref 65–99)
OSMOLALITY: 274 (ref 275–301)
POTASSIUM: 4.7 mmol/L (ref 3.5–5.1)
SODIUM: 137 mmol/L (ref 136–145)

## 2013-03-14 LAB — PHOSPHORUS: PHOSPHORUS: 3 mg/dL (ref 2.5–4.9)

## 2013-03-14 LAB — MAGNESIUM: Magnesium: 1.7 mg/dL — ABNORMAL LOW

## 2013-03-14 LAB — AMMONIA: Ammonia, Plasma: 46 mcmol/L — ABNORMAL HIGH (ref 11–32)

## 2013-03-15 LAB — MAGNESIUM: MAGNESIUM: 1.9 mg/dL

## 2013-03-16 ENCOUNTER — Ambulatory Visit: Payer: Self-pay | Admitting: Neurology

## 2013-03-16 LAB — PROTIME-INR
INR: 1.3
Prothrombin Time: 15.6 secs — ABNORMAL HIGH (ref 11.5–14.7)

## 2013-03-16 LAB — URINALYSIS, COMPLETE
Bacteria: NONE SEEN
Bilirubin,UR: NEGATIVE
GLUCOSE, UR: NEGATIVE mg/dL (ref 0–75)
Hyaline Cast: 1
KETONE: NEGATIVE
Leukocyte Esterase: NEGATIVE
NITRITE: NEGATIVE
Ph: 5 (ref 4.5–8.0)
Protein: 30
RBC,UR: 151 /HPF (ref 0–5)
Specific Gravity: 1.014 (ref 1.003–1.030)
Squamous Epithelial: NONE SEEN
WBC UR: NONE SEEN /HPF (ref 0–5)

## 2013-03-16 LAB — CBC WITH DIFFERENTIAL/PLATELET
BASOS ABS: 0 10*3/uL (ref 0.0–0.1)
BASOS ABS: 0 10*3/uL (ref 0.0–0.1)
BASOS PCT: 0.4 %
Basophil %: 0.2 %
EOS ABS: 0.3 10*3/uL (ref 0.0–0.7)
EOS PCT: 6.5 %
Eosinophil #: 0.1 10*3/uL (ref 0.0–0.7)
Eosinophil %: 1.6 %
HCT: 41.4 % (ref 40.0–52.0)
HCT: 37.6 % — ABNORMAL LOW (ref 40.0–52.0)
HGB: 14.2 g/dL (ref 13.0–18.0)
HGB: 13 g/dL (ref 13.0–18.0)
LYMPHS ABS: 0.7 10*3/uL — AB (ref 1.0–3.6)
Lymphocyte #: 1.6 10*3/uL (ref 1.0–3.6)
Lymphocyte %: 16.4 %
Lymphocyte %: 21.4 %
MCH: 29.8 pg (ref 26.0–34.0)
MCH: 29.8 pg (ref 26.0–34.0)
MCHC: 34.4 g/dL (ref 32.0–36.0)
MCHC: 34.6 g/dL (ref 32.0–36.0)
MCV: 87 fL (ref 80–100)
MCV: 86 fL (ref 80–100)
MONO ABS: 0 x10 3/mm — AB (ref 0.2–1.0)
MONO ABS: 0.2 x10 3/mm (ref 0.2–1.0)
MONOS PCT: 0.5 %
Monocyte %: 2.9 %
NEUTROS PCT: 76.4 %
NEUTROS PCT: 73.7 %
Neutrophil #: 3.4 10*3/uL (ref 1.4–6.5)
Neutrophil #: 5.5 10*3/uL (ref 1.4–6.5)
PLATELETS: 192 10*3/uL (ref 150–440)
Platelet: 170 10*3/uL (ref 150–440)
RBC: 4.78 10*6/uL (ref 4.40–5.90)
RBC: 4.37 10*6/uL — ABNORMAL LOW (ref 4.40–5.90)
RDW: 14.6 % — ABNORMAL HIGH (ref 11.5–14.5)
RDW: 14.8 % — ABNORMAL HIGH (ref 11.5–14.5)
WBC: 4.4 10*3/uL (ref 3.8–10.6)
WBC: 7.5 10*3/uL (ref 3.8–10.6)

## 2013-03-16 LAB — MAGNESIUM: MAGNESIUM: 1.2 mg/dL — AB

## 2013-03-16 LAB — BASIC METABOLIC PANEL
ANION GAP: 6 — AB (ref 7–16)
BUN: 16 mg/dL (ref 7–18)
CALCIUM: 8.7 mg/dL (ref 8.5–10.1)
CO2: 28 mmol/L (ref 21–32)
Chloride: 104 mmol/L (ref 98–107)
Creatinine: 1.24 mg/dL (ref 0.60–1.30)
EGFR (African American): >60
EGFR (Non-African Amer.): >60
GLUCOSE: 103 mg/dL — AB (ref 65–99)
Osmolality: 277 (ref 275–301)
POTASSIUM: 3.9 mmol/L (ref 3.5–5.1)
Sodium: 138 mmol/L (ref 136–145)

## 2013-03-16 LAB — APTT: Activated PTT: 39.2 secs — ABNORMAL HIGH (ref 23.6–35.9)

## 2013-03-16 LAB — TRIGLYCERIDES: TRIGLYCERIDES: 167 mg/dL (ref 0–200)

## 2013-03-17 ENCOUNTER — Ambulatory Visit: Payer: Self-pay | Admitting: Neurology

## 2013-03-17 LAB — CBC WITH DIFFERENTIAL/PLATELET
Basophil #: 0 10*3/uL (ref 0.0–0.1)
Basophil %: 0 %
EOS ABS: 0.1 10*3/uL (ref 0.0–0.7)
EOS PCT: 0.7 %
HCT: 37 % — AB (ref 40.0–52.0)
HGB: 12.4 g/dL — ABNORMAL LOW (ref 13.0–18.0)
LYMPHS ABS: 0.6 10*3/uL — AB (ref 1.0–3.6)
LYMPHS PCT: 3.9 %
MCH: 29.2 pg (ref 26.0–34.0)
MCHC: 33.5 g/dL (ref 32.0–36.0)
MCV: 87 fL (ref 80–100)
MONO ABS: 0.3 x10 3/mm (ref 0.2–1.0)
MONOS PCT: 2.1 %
Neutrophil #: 14.4 10*3/uL — ABNORMAL HIGH (ref 1.4–6.5)
Neutrophil %: 93.3 %
PLATELETS: 123 10*3/uL — AB (ref 150–440)
RBC: 4.24 10*6/uL — AB (ref 4.40–5.90)
RDW: 15.1 % — AB (ref 11.5–14.5)
WBC: 15.5 10*3/uL — ABNORMAL HIGH (ref 3.8–10.6)

## 2013-03-17 LAB — MAGNESIUM: Magnesium: 1.5 mg/dL — ABNORMAL LOW

## 2013-03-17 LAB — BASIC METABOLIC PANEL
Anion Gap: 6 — ABNORMAL LOW (ref 7–16)
BUN: 30 mg/dL — AB (ref 7–18)
CHLORIDE: 109 mmol/L — AB (ref 98–107)
Calcium, Total: 7.4 mg/dL — ABNORMAL LOW (ref 8.5–10.1)
Co2: 23 mmol/L (ref 21–32)
Creatinine: 2.47 mg/dL — ABNORMAL HIGH (ref 0.60–1.30)
EGFR (African American): 33 — ABNORMAL LOW
EGFR (Non-African Amer.): 28 — ABNORMAL LOW
GLUCOSE: 80 mg/dL (ref 65–99)
OSMOLALITY: 281 (ref 275–301)
POTASSIUM: 4.1 mmol/L (ref 3.5–5.1)
Sodium: 138 mmol/L (ref 136–145)

## 2013-03-17 LAB — HEPATIC FUNCTION PANEL A (ARMC)
ALK PHOS: 74 U/L
ALT: 56 U/L (ref 12–78)
Albumin: 2.4 g/dL — ABNORMAL LOW (ref 3.4–5.0)
BILIRUBIN DIRECT: 1 mg/dL — AB (ref 0.00–0.20)
Bilirubin,Total: 1.5 mg/dL — ABNORMAL HIGH (ref 0.2–1.0)
SGOT(AST): 79 U/L — ABNORMAL HIGH (ref 15–37)
TOTAL PROTEIN: 6.5 g/dL (ref 6.4–8.2)

## 2013-03-17 LAB — AMMONIA: AMMONIA, PLASMA: 38 umol/L — AB (ref 11–32)

## 2013-03-17 LAB — VANCOMYCIN, TROUGH: Vancomycin, Trough: 27 ug/mL (ref 10–20)

## 2013-03-17 LAB — RAPID HIV-1/2 QL/CONFIRM: HIV-1/2, RAPID QL: NEGATIVE

## 2013-03-17 LAB — PHOSPHORUS: Phosphorus: 4.4 mg/dL (ref 2.5–4.9)

## 2013-03-18 LAB — CULTURE, BLOOD (SINGLE)

## 2013-03-18 LAB — PHOSPHORUS: Phosphorus: 4 mg/dL (ref 2.5–4.9)

## 2013-03-18 LAB — CBC WITH DIFFERENTIAL/PLATELET
BASOS PCT: 0.2 %
Basophil #: 0 10*3/uL (ref 0.0–0.1)
EOS ABS: 0.1 10*3/uL (ref 0.0–0.7)
EOS PCT: 0.9 %
HCT: 33 % — ABNORMAL LOW (ref 40.0–52.0)
HGB: 11 g/dL — AB (ref 13.0–18.0)
LYMPHS PCT: 7.7 %
Lymphocyte #: 1.1 10*3/uL (ref 1.0–3.6)
MCH: 29.2 pg (ref 26.0–34.0)
MCHC: 33.4 g/dL (ref 32.0–36.0)
MCV: 88 fL (ref 80–100)
MONO ABS: 0.6 x10 3/mm (ref 0.2–1.0)
MONOS PCT: 4.3 %
Neutrophil #: 12.6 10*3/uL — ABNORMAL HIGH (ref 1.4–6.5)
Neutrophil %: 86.9 %
PLATELETS: 72 10*3/uL — AB (ref 150–440)
RBC: 3.77 10*6/uL — ABNORMAL LOW (ref 4.40–5.90)
RDW: 15.5 % — AB (ref 11.5–14.5)
WBC: 14.5 10*3/uL — ABNORMAL HIGH (ref 3.8–10.6)

## 2013-03-18 LAB — AMMONIA: Ammonia, Plasma: <10 mcmol/L (ref 11–32)

## 2013-03-18 LAB — BASIC METABOLIC PANEL
Anion Gap: 19 — ABNORMAL HIGH (ref 7–16)
BUN: 42 mg/dL — ABNORMAL HIGH (ref 7–18)
Calcium, Total: 7.1 mg/dL — ABNORMAL LOW (ref 8.5–10.1)
Chloride: 107 mmol/L (ref 98–107)
Co2: 21 mmol/L (ref 21–32)
Creatinine: 2.91 mg/dL — ABNORMAL HIGH (ref 0.60–1.30)
EGFR (African American): 27 — ABNORMAL LOW
EGFR (Non-African Amer.): 23 — ABNORMAL LOW
Glucose: 88 mg/dL (ref 65–99)
Osmolality: 302 (ref 275–301)
Potassium: 4.2 mmol/L (ref 3.5–5.1)
Sodium: 147 mmol/L — ABNORMAL HIGH (ref 136–145)

## 2013-03-18 LAB — TROPONIN I: Troponin-I: <0.02 ng/mL

## 2013-03-18 LAB — MAGNESIUM: Magnesium: 2 mg/dL

## 2013-03-22 LAB — CULTURE, BLOOD (SINGLE)

## 2013-04-12 DEATH — deceased

## 2014-05-04 NOTE — H&P (Signed)
PATIENT NAME:  [name], [name] MR#:  [id number] DATE OF BIRTH:  Jun 10, 1955  DATE OF ADMISSION:  05/03/2012  PRIMARY CARE PHYSICIAN: [facility] ER PHYSICIAN: Shirley Friar. Braud, MD     CHIEF COMPLAINT: Seizures.   HISTORY OF PRESENT ILLNESS: The patient is a 59 year old male with history of hypertension, brought in by the EMS. The patient's wife noticed that when he woke up this morning he was snoring, and she went to check on him.  The patient was found to have heavy breathing with froth coming out of the mouth and with eyes rolled back; and the patient also was unresponsive, so she called 911. By the time EMS arrived, the patient was disoriented and combative, and the patient was brought in by EMS.  En route he had a seizure, and he was given 2 mg of Versed and was brought into the Emergency Room. The patient was combative when he came to the Emergency Room. The patient received 9 mg of Versed in the ER for calming him down for agitation, and after that he was intubated by ER physician for a possible postictal state and prophylactically. The patient also received fentanyl during the intubation time and etomidate. Right now he is on propofol drip. The patient received 1.5 grams of Keppra for seizure, and we are going to admit him to the ICU.  History was obtained from the wife.  According to the wife, the patient is in the process of getting dentures, so 2 weeks ago the bottom teeth were pulled out, and a month ago the top teeth were pulled out, and the patient received antibiotics when the top teeth were pulled out and he received amoxicillin; but when the bottom teeth were pulled, he did not get any antibiotics. The patient also had a bone spur removed from the upper jaw a week ago.  According to the wife, the patient has been complaining of headache for the past 2 days.  Today, the patient's wife noticed him with unresponsiveness, froth coming with eyes rolling and also incontinent.    PAST  MEDICAL HISTORY: Significant for hypertension, neuropathy. The patient also has gout. The patient was taking Venlafaxine 75 mg extended release b.i.d. That was stopped a week ago because that was not helping him. He took it about 1-1/2 months.   HOME MEDICATIONS: Allopurinol 100 mg daily, amitriptyline 100 mg daily, metoprolol 50 mg p.o. b.i.d., Venlafaxine 75 mg p.o. b.i.d. which was stopped 2 weeks ago.   ALLERGIES: No known drug allergies.  SOCIAL HISTORY: He smokes about 1 pack per day.  Between, both wife and patient quit for 6 months but restarted again.  He has been smoking since age 43.  Occasional alcohol. No drugs. The patient is ambulatory at home without any help.    PAST SURGICAL HISTORY: Significant for bladder cancer, had 3 to 4 surgeries and had a BCG treatment, and he is in remission now.  He had a urologist at [facility], and he is supposed to see him very soon.    FAMILY HISTORY: The patient's dad had diabetes.  Hypertension for the brother.   REVIEW OF SYSTEMS: Unable to obtain because he is intubated and sedated.   PHYSICAL EXAMINATION: VITAL SIGNS: Temperature 98.7. Heart rate initially 147, during my exam heart rate is 120.  He is in sinus tach.  Blood pressure initially reported as 223/174, but at the time of my visit blood pressure was 113/63. He is on full vent support 650/50/18/PEEP of 5,  and saturation is 100% intubated and sedated.  HEENT: Head is atraumatic, normocephalic. Pupils are equally reacting to light. No tympanic membrane congestion. No turbinate hypertrophy. No oropharyngeal erythema.  NECK: The patient is intubated and ET tube is present. No lymphadenopathy. Thyroid is in the midline.  LUNGS: The patient has expiratory wheeze in all lung fields.  CARDIOVASCULAR: S1, S2 regular.  No murmurs.  ABDOMEN:  Obese, bowel sounds present. No organomegaly. No hernias.  EXTREMITIES: No extremity edema. No cyanosis. No clubbing.  NEUROLOGIC: Intubated and sedated,  unable to do full neurological exam. SKIN: I did not see any skin rashes.   LABORATORY AND RADIOLOGICAL DATA:  Troponin is up at 0.13. Salicylate level is 4.3. Acetaminophen less than 2.0.   Electrolytes: Sodium 140, potassium 3.5, chloride 108, bicarbonate 23, BUN 14, creatinine 1.13, glucose 162. LFTs within normal limits. Magnesium slightly low at 1.3. The patient had an LP done, CSF for cultures, so CSF shows clear with no WBC, no RBC. ABG done on vent showed pH 7.29, pCO2 49, pO2 109 and saturations 97.6%.   Chest x-ray shows ET tube in good position. Lungs are clear.  CT head done because of new onset seizure did not show any evidence of ischemia or hemorrhage. No intracranial mass. No abnormal intracranial calcifications. EKG: Sinus tachycardia with 149 beats per minute. No ST-T changes.   ASSESSMENT AND PLAN: The patient is a 59 year old male patient with: 1.  New onset seizure and intubated prophylactically for seizures: Right now he is going to be admitted to the ICU for new onset seizures with respiratory failure. The patient will be on full vent support and already on vancomycin and Zosyn.  Consult Dr. Mortimer Fries  for vent management and continue broad-spectrum antibiotics, add Duo-Nebs for him.  2.  Seizures, new onset:  CT head unremarkable. We spoke with specialist on call. The patient is already on Keppra.  I am waiting for further recommendations from the specialist on call.  We do not have any neurologist.  The patient will need further work-up for seizures, and it also could be from discontinuation of Effexor; and with his symptoms of headache and seizures, we need to rule out intracranial abnormality.  The patient's LP is negative in the ER.  He will need an MRI down the road and also EEG. Continue Keppra at this time.  3.  Hypertension: Blood pressure was initially high, but now it is controlled.  We are  holding the BP medications and see how he does.  4.  Slightly elevated troponin is  probably secondary to seizures: The patient's EKG shows sinus tachycardia. We will continue aspirin, and get the echocardiogram and cycle the troponins.  5.  Hypomagnesemia: Replace the magnesium.  6. Chronic obstructive pulmonary disease with history of heavy smoking: Continue Duo-Nebs and also add small dose of steroids.   TIME SPENT ON HISTORY AND PHYSICAL: About 60 minutes.   I discussed the plan with the patient's wife in detail.    CODE STATUS:  FULL CODE.     ____________________________ Epifanio Lesches, MD sk:cb D: 05/03/2012 14:18:35 ET T: 05/03/2012 14:45:22 ET JOB#: [id number]  cc: Epifanio Lesches, MD, <Dictator> [facility] Epifanio Lesches MD ELECTRONICALLY SIGNED 05/23/2012 11:41

## 2014-05-04 NOTE — Consult Note (Signed)
PATIENT NAME:  [name], [name] MR#:  [id number] DATE OF BIRTH:  June 28, 1955  NEUROLOGY CONSULTATION  DATE OF CONSULTATION:  05/04/2012  REFERRING PHYSICIAN:   CONSULTING PHYSICIAN:  Leotis Pain, MD  LOCATION: CCU bed 18.   REASON FOR CONSULTATION: Seizures.   HISTORY OF PRESENT ILLNESS: This is a 59 year old male with a past medical history of hypertension, brought in by EMS due to patient's wife noticing patient waking up, snoring, and her finding him with frothy sputum. Then she noticed his eyes rolling back, and the patient became unresponsive. When EMS arrived the patient appeared to be disoriented, combative. En route he had a generalized tonic-clonic seizure that was relieved with Ativan. Upon coming to Emergency Room the patient was intubated, loaded with Keppra 1.5 grams and started on 500 q.12.   According to the patient's wife the patient was in the process of getting dentures about two weeks ago. His bottom teeth were pulled and the patient received antibiotics. Later that week the patient had his bottom teeth pulled but he did not receive antibiotics at that time. According to the wife the patient has been complaining of a generalized, diffuse headache for the past 2 days prior to admission.    PAST MEDICAL HISTORY: Significant for hypertension, neuropathy. He has gout.   HOME MEDICATIONS: Include allopurinol, amitriptyline, metoprolol, venlafaxine; that was stopped about 2 weeks ago.   SOCIAL HISTORY: He smokes 1 pack per day. Occasional alcohol use. No drugs.   PAST SURGICAL HISTORY: Significant for bladder cancer. He had 3 or 4 surgeries, and he is in remission now.   FAMILY HISTORY: Significant for a family history of diabetes and hypertension in his brother.   REVIEW OF SYSTEMS:  Unable to obtain as the patient is sedated, intubated, does not follow commands.   PHYSICAL EXAMINATION: VITAL SIGNS: Blood pressure 197/95, pulse ox 95. Pulse is 108.  NEUROLOGICAL  EVALUATION: Even though he is off propofol and Versed he does not appear to be following commands. Pupils are 4 mm to 3 mm, reactive bilaterally. Corneals are present bilaterally. When examining his eyes he appears to have forced-field deviation to the right, and then it comes back to baseline. No signs of facial droop noted.  MOTOR: Patient grimaces to painful stimuli bilaterally, but does not withdraw from painful stimuli. Unable to assess sensation, gait, or coordination.   LABORATORY WORKUP: Showed a slight elevation of troponin, suspected to be demand ischemia. The patient is status post LP. Did not show any gross abnormalities.   RADIOLOGY: The patient is status post CT head. No significant evidence of acute abnormalities were noted.   IMPRESSION: A 59 year old male presenting with altered mental status, agitation, disorientation, found to be seizing, admitted to ICU status post intubation and started on a Keppra load of 1.5 grams and continued at 500 b.i.d.   At this time of my examination I believe the patient is actually actively seizing. The case was discussed with the EEG tech, and she believes that the preliminary report that the patient is having left-sided sharps, which would be consistent with his visual field gaze going to her right side. Dr. Melrose Nakayama will read the official EEG.   PLAN: At this point, would increase his Keppra to 1 gram q.12, load with Dilantin 20 mg per kg,  and continue 100 mg t.i.d. Please restart his sedation, specifically Versed. Please obtain the EEG stat in a.m. tomorrow after the patient is loaded. Please obtain Dilantin level afterload tomorrow.   In the  future the patient will require MRI of the brain with contrast, specifically because of the history of him going to the dentist and at the time not receiving antibiotics to make sure he does not have any encephalitis that we are missing which could be contributing to seizures; it is rare that patients at the age  of 73 start having seizures without a reason.  Thank you. It was a pleasure seeing this patient.   ____________________________ Leotis Pain, MD yz:dm D: 05/04/2012 12:12:59 ET T: 05/04/2012 12:39:54 ET JOB#: [id number]  cc: Leotis Pain, MD, <Dictator> Leotis Pain MD ELECTRONICALLY SIGNED 06/05/2012 19:15

## 2014-05-04 NOTE — Consult Note (Signed)
PATIENT NAME:  [name] MR#:  [id number] DATE OF BIRTH:  1955-10-05  DATE OF CONSULTATION:  05/04/2012  REFERRING PHYSICIAN: Dr. Posey Pronto.  CONSULTING PHYSICIAN:  Corey Skains, MD  REASON FOR CONSULTATION: A 60 year old with seizures, hypertension history with elevated troponin.   CHIEF COMPLAINT: The patient has had significant arrest with seizure and no current consciousness.    HISTORY OF PRESENT ILLNESS: This is a 59 year old male with known history of hypertension, but no other history of cardiovascular disease, who has had new onset of seizure. The patient was found down unconscious and no responsiveness at all. There was some apparent seizure activity at the time, but no other significant issues hemodynamically. The patient was stable on arrival. The patient did have EKG showing sinus tachycardia, but no evidence of acute myocardial infarction or ST elevation myocardial infarction. The patient has had elevated troponin of 0.23, more consistent with demand ischemia versus seizure and hypoxia. The patient is hemodynamically stable at this time, but has no responsiveness.   PAST MEDICAL HISTORY: Hypertension.   FAMILY HISTORY: No apparent family history of cardiovascular disease.   SOCIAL HISTORY: Cannot be assessed.   ALLERGIES: AS LISTED.   MEDICATIONS: As listed.   PHYSICAL EXAMINATION:  VITAL SIGNS: Blood pressure is 100/60 bilaterally, heart rate is 100 and regular.  GENERAL: He is a well-appearing male who is completely intubated and sedated and unconscious.  HEAD, EYES, EARS, NOSE AND THROAT: No icterus, thyromegaly, ulcers, hemorrhage or xanthelasma.  CARDIOVASCULAR: Regular rate and rhythm. Normal S1 and S2, without murmur, gallop or rub. PMI is inferiorly displaced. Carotid upstroke normal without bruit. Jugular venous pressure is normal.  LUNGS: Have a few basilar crackles with normal respirations.  ABDOMEN: Soft, nontender, without hepatosplenomegaly or masses.  Abdominal aorta is normal size without bruit.  EXTREMITIES: Show 2+ palpable pulses in dorsal, pedal, radial and femoral arteries, without lower extremity edema, cyanosis, clubbing or ulcers.  NEUROLOGIC: The patient is completely obtunded and intubated.   ASSESSMENT: A 59 year old male with history of hypertension, with seizure and respiratory failure, now intubated. Hemodynamically stable, without evidence of myocardial infarction by electrocardiogram, and troponin more consistent with demand ischemia.   RECOMMENDATIONS:  1. Echocardiogram for LV systolic dysfunction, valvular heart disease as a cause of respiratory failure.  2. Continue serial ECG and enzymes to assess possible extent of myocardial infarction.  3. Continue supportive care, oxygenation. 4. Further investigation for possibility of infection and treatment of seizures as well.  5. Further diagnostic testing and treatment options after the patient awakens.    ____________________________ Corey Skains, MD bjk:OSi D: 05/04/2012 09:07:23 ET T: 05/04/2012 09:34:33 ET JOB#: [id number]  cc: Corey Skains, MD, <Dictator> Corey Skains MD ELECTRONICALLY SIGNED 05/12/2012 8:47

## 2014-05-04 NOTE — Discharge Summary (Signed)
PATIENT NAME:  [name], [name] MR#:  [id number] DATE OF BIRTH:  31-Dec-1955  DATE OF ADMISSION:  05/03/2012 DATE OF DISCHARGE:  05/05/2012   PRIMARY CARE PHYSICIAN:  [facility].  NEUROLOGIST:  Leotis Pain, MD  DISCHARGE DIAGNOSES: 1.  Probable status epilepticus with new onset seizure on Dilantin and Keppra. Seizure could be due to possible encephalitis, considering recent dental visit and not getting antibiotics at the time.  2. Acute respiratory failure mainly for respiratory protection due to status epilepticus and metabolic encephalopathy.  3.  Metabolic encephalopathy likely from seizure and status epilepticus, now sedated.  4.  Malignant hypertension on hydralazine for better blood pressure control.  5.  Hypomagnesemia, replaced and resolved.  6.  Elevated troponin likely due to supply/demand ischemia.  7.  Neuropathic pain.  Was on Effexor which was stopped. 8.  Chronic obstructive pulmonary disease, stable.  SECONDARY DIAGNOSES: 1.  Recent history of tooth extraction.  2.  Hypertension.  3.  Gout.   CONSULTATIONS: 1.  Pulmonary, Dr. Mortimer Fries.  2.  Neurology, Dr. Irish Elders 3.  Cardiology, Dr. Nehemiah Massed.   PROCEDURES/RADIOLOGICAL DATA:   1.  EEG on 05/04/2012 showed moderate to severe diffuse slowing indicative of bihemispheric dysfunction. Could be toxic, metabolic or primary neuronal disorder.   2.  CT scan of the head without contrast showed no evidence of acute ischemic or hemorrhagic infarction. No intracranial mass effect. No abnormal intracranial calcification. No air-fluid level in the sinuses.  3.  Chest x-ray on 05/03/2012  showed no acute abnormality. Endotracheal tube present with the tip just below the level of sternal clavicular joints.  4.  Chest x-ray on 05/04/2012  showed increased interstitial markings bilaterally in the perihilar region as well as bibasilar atelectasis.  5.  A 2-D echocardiogram on 05/04/2012 showed normal LV systolic function with an  ejection fraction of 50% to 55%. Low normal LV systolic function. Mildly dilated left atrium. Normal RV systolic pressure. Impaired relaxation of LV diastolic filling.   MAJOR LABORATORY PANEL:  Urinalysis on admission was negative. Blood cultures x 2 were negative on 05/03/2012. Urine culture was negative.  CSF tube #3 had 12l WBCs, 44 lymphocytes, 0 neutrophils, protein was 73. glucose was 89. There has been no growth reported in 48 hours, no WBCs, no RBCs in CSF culture.  Serum salicylate level was elevated with a value of 4.3. Tylenol level was less than 2.   HISTORY AND SHORT HOSPITAL COURSE:  The patient is a 59 year old male with above-mentioned medical problems who was admitted for seizure. Please see Dr. Governor Specking dictated history and physical for further details. The patient was started on Keppra. He did undergo a lumbar puncture in the Emergency Room and it was negative. Neurology consultation was obtained with Dr. Irish Elders who felt the patient was having status epilepticus and loaded him with IV fosphenytoin and started him on maintenance dose of fosphenytoin and increased the dose of Keppra also. The patient was also emergently intubated by Dr. Mortimer Fries for respiratory protection and has remained on the ventilator and underwent EEG on 05/04/2012  with results as noted above. He also had a cardiology consultation with Dr. Nehemiah Massed concerning his borderline elevated enzymes. This was thought to be due to supply/demand ischemia.   The patient is being transferred to [facility] for further evaluation and management. She has been accepted by Dr. Delmer Islam by neurology at [facility] and the patient is being transferred as per family request and for further evaluation and management.  Total time taking care of this patient: 55 minutes.    ____________________________ [name]. Manuella Ghazi, MD vss:ct D: 05/05/2012 10:56:51 ET T: 05/05/2012 11:17:40 ET JOB#: [id number]  cc: Jarek Longton S. Manuella Ghazi, MD,  <Dictator> [facility] Mariane Duval, MD Leotis Pain, MD Corey Skains, MD  [name] [facility] MD ELECTRONICALLY SIGNED 05/09/2012 11:34

## 2014-05-05 NOTE — Consult Note (Signed)
Brief Consult Note: Diagnosis: PEA arrest with respiratory failure, renal insufficiency, atrial ectopy; gram neg septicemia.   Patient was seen by consultant.   Consult note dictated.   Discussed with Attending MD.   Comments: Pt is to be transferred to [facility] agree with echo and Tn although the ionterpretation of the latter will be hard No anticoagulation wtih concerns of esophogeal varices.  Electronic Signatures: Deboraha Sprang (MD)  (Signed 07-Mar-15 10:01)  Authored: Brief Consult Note   Last Updated: 07-Mar-15 10:01 by Deboraha Sprang (MD)

## 2014-05-05 NOTE — Consult Note (Signed)
PATIENT NAME:  [name], [name] MR#:  [id number] DATE OF BIRTH:  1955/07/12  DATE OF CONSULTATION:  03/17/2013  REFERRING PHYSICIAN:  Demetrios Loll, MD CONSULTING PHYSICIAN:  Cheral Marker. Ola Spurr, MD  REASON FOR CONSULTATION: Gram-negative bacteremia, fevers, altered mental status.   HISTORY OF PRESENT ILLNESS: This is a 59 year old gentleman admitted on March 1 with  altered mental status. History is obtained from nursing staff and from review of the chart, as there is no family available and the patient is altered. The patient had apparently had a several day duration of altered mental status with confusion, twitching, and tremors. He was confused on the day of admission and was brought to the Emergency Room, where he was noted to be hypoxic as well as quite altered. Initially the patient's tox screen was positive for methadone, although it is unclear when he was taking this. He had initial renal failure, but his white count was normal and he was afebrile. He then developed fevers, and blood cultures were done and growing gram-negative rods. He was initially intubated for several days, but was extubated on March 4. He has remained quite delirious. He has been on Precedex. He has also been on morphine and Geodon. He has been very agitated, febrile to 104, and he has also had some issues with GI bleeding and is found to be jaundiced today. His lactulose level was elevated on admission. He apparently has a history of hepatitis C.   PAST MEDICAL HISTORY: Per review of chart:  1.  Seizure.  2.  Hypertension.  3.  Hepatitis C.  4.  Diabetes. 5.  Peripheral neuropathy.   PAST SURGICAL HISTORY: None known.    ALLERGIES: No known drug allergies.   SOCIAL HISTORY: This is unable to be obtained, but apparently the patient does smoke. Per report, the patient does not drink alcohol, but this is very unclear.   REVIEW OF SYSTEMS: Unable to be obtained.   ANTIBIOTICS SINCE ADMISSION: Include Zosyn begun March 4,  vancomycin begun March 4.   OTHER MEDICATIONS: Include acetaminophen, amitriptyline, Haldol, heparin, lactulose, metoprolol, nitroglycerin, Zofran, venlafaxine, lorazepam, insulin, morphine, Geodon, clonazepam, Keppra, pantoprazole.  PHYSICAL EXAMINATION:  VITAL SIGNS: Temperature current 100.3, pulse 96, blood pressure 114/62, respiratory rate 26, sat 96% on 4 liters. T-max over the last 24 hours has been 101.4. Prior to that, his temperature was 102.1 on March 5. It actually peaked in the morning of March 5 at 104.2. On admission, he was afebrile and he remained afebrile for several days until the 5th.  GENERAL: He is quite confused. He is grunting. His eyes are awake, but he does not seem to be alert.  HEENT: His pupils are equal, round, and reactive to light and accommodation. His oropharynx is dry. He is jaundiced in his eyes.  NECK: Somewhat difficult to move, and he does not follow commands.  HEART: Tachy but regular.  LUNGS: Coarse breath sounds bilaterally.  ABDOMEN: Distended, tympanic. Difficult to tell if he is tender.  EXTREMITIES: He has 1+ edema. He has no joint swelling or redness.   LABORATORY DATA:  Blood cultures x 2 on March 5 are growing gram-negative rods, yet to be identified. White blood count March 6 was 15.5 (prior to that, it was 7.5), hemoglobin 12.4, platelets 123. Urinalysis showed no white cells on March 5. Renal function shows creatinine of 2.47, up from 1.24. LFTs on admission were normal, although none have been checked since then.   IMAGING: A chest x-ray done March 5  shows improved aeration in the left lung bases. X-ray of his abdomen showed moderate generalized bowel dilation, considerations for ileus or a degree of obstruction. There was no free air. Ultrasound of his abdomen March 3 showed an enlarged liver, consistent with fatty infiltration. There was no splenomegaly. Gallbladder was grossly normal. Common bile duct was top-normal at 6.3 mm. Pancreas, kidneys,  and abdominal aorta had no abnormalities.   IMPRESSION: A quite ill 59 year old gentleman with an uncertain past medical history of substance abuse or alcoholism, admitted with altered mental status. He was initially intubated and then extubated. He has now had high fevers starting March 5 to 104 and blood cultures positive for gram-negatives. Urinalysis is negative. Chest x-ray does not show an impressive pneumonia. His abdomen is distended and does have either ileus or partial obstruction on a KUB done. He possibly has hepatitis per history. His LFTs are pending from today.   RECOMMENDATIONS: 1.  Continue Zosyn. I agree with discontinuing vancomycin at this point.  2.  Check an HIV as well as hepatitis serologies.  3.  Would repeat blood cultures to document clearance given how ill he is.  4.  Consider further imaging of the abdomen to evaluate if he decompensates any further.   Thank you for the consult. I will be glad to follow with you.   ____________________________ Cheral Marker. Ola Spurr, MD dpf:jcm D: 03/17/2013 16:00:07 ET T: 03/17/2013 16:37:31 ET JOB#: [id number]  cc: Cheral Marker. Ola Spurr, MD, <Dictator> DAVID Ola Spurr MD ELECTRONICALLY SIGNED 03/20/2013 19:13

## 2014-05-05 NOTE — Consult Note (Signed)
PATIENT NAME:  [name], [name] MR#:  [id number] DATE OF BIRTH:  [birth year]  DATE OF CONSULTATION:  03/18/2013  CONSULTING PHYSICIAN:  Deboraha Sprang, MD  Thank you very much for asking [place] to see Mr. [name] for complex atrial ectopy.   The patient is a 59 year old gentleman who was admitted on March 1 with altered mental status with benzodiazepines and methadone in his urine screen, but no other obvious issues. He developed renal insufficiency, which improved and then worsened. He was initially afebrile and then became febrile, with blood cultures done showing gram-negative rods. He has temperatures as high as 104.   The patient also had problems with some GI bleeding and was noted to be jaundiced. His ammonia level was elevated on admission, and this occurs in the context of apparent hepatitis C. He also has diabetes and hypertension and a seizure disorder.   This morning, frequent and complex atrial ectopy was identified. He then had progressive bradycardia associated with respiratory insufficiency that progressed over many, many minutes. Efforts to intubate him were challenging, and he was actually a systolic for 5+ minutes. Following intubation, he currently is off pressors, which had previously been required. Other notable tests today include again worsening renal insufficiency. Chest x-ray this morning demonstrates mild edema. No focal infiltrates.   He has no known cardiac history. This is information that was obtained from the chart, as no family is present.   PAST MEDICAL HISTORY: As noted above.   PAST SURGICAL HISTORY: Is not known.   ALLERGIES: He has no known drug allergies.   SOCIAL HISTORY: He is married. Apparently he smokes but does not use alcohol.   REVIEW OF SYSTEMS: Unable to obtain.   PHYSICAL EXAMINATION: VITAL SIGNS: Currently he is borderline febrile at 99.7. His pulse is 95. His blood pressure is 98/43. He is intubated with 100% oxygen.  GENERAL: He is  unresponsive.  LUNGS: There is good lung movement laterally.  CARDIOVASCULAR: Heart sounds are regular and distant.  ABDOMEN: Soft and quiet.  EXTREMITIES: Trace edema.  SKIN: Warm.   LABORATORY AND DIAGNOSTIC DATA: Electrocardiogram is pending, but telemetry demonstrates normal rhythm. Creatinine this morning was 2.9. Potassium was 4.2. Magnesium this morning was 2.0.   IMPRESSION: 1.  Atrial ectopy. 2.  Pulseless electrical activity arrest associated with respiratory failure.  3.  Oral bleeding in the context of intubation for #2.  4.  Gram-negative sepsis  5.  Mental status alterations, question cause.  6.  Renal insufficiency.   PLAN: Mr. Mundorf had a catastrophic event this morning with prolonged intubation. The atrial ectopy that was noted overnight is likely related to stress but does not prompt the need for anticoagulation, which would obviously be contraindicated in the setting of recent oral bleeding.   Echocardiogram has been ordered and I think this is reasonable, although I am not quite sure how we will interpret it in the setting of gram-negative sepsis. Furthermore, troponins have been ordered; they too will be difficult to interpret in the context of renal insufficiency.   The patient is to be transported to [facility]. We will be available to help as needed.   ____________________________ Deboraha Sprang, MD sck:jcm D: 03/18/2013 10:01:28 ET T: 03/18/2013 18:50:05 ET JOB#: [id number]  cc: Deboraha Sprang, MD, <Dictator> Deboraha Sprang MD ELECTRONICALLY SIGNED 04/25/2013 13:05

## 2014-05-05 NOTE — H&P (Signed)
PATIENT NAME:  [name], [name] MR#:  [id number] DATE OF BIRTH:  [birth year]  DATE OF ADMISSION:  03/12/2013  REFERRING PHYSICIAN: Dr. Francene Castle   PRIMARY CARE PHYSICIAN: Nonlocal.   CHIEF COMPLAINT: Altered mental status.  HISTORY OF PRESENT ILLNESS: This is a 59 year old Caucasian gentleman with past medical history of seizure, hypertension, and diabetes presenting with altered mental status. At this time, he is unable to provide any information given his mental status and medical condition. All information obtained by his wife, Elmyra Ricks. Contact number [phone]. Per her, he has been having 3 day duration of altered mental status, described mainly as confusion, which started out minor and now progressively worsened with associated tremors and dropping things, from food to cups. He has been confused about the events of the day. he believed he picked up her grandson; however, this was not true. Denies any recent focal neurological symptoms, fevers, chills. The patient was in his usual state of health prior to start up of these symptoms. On arrival to the Emergency Department noted be hypoxemic as well as altered. However, he was conversant. He appeared to be agitated and increasingly confused. He received some Ativan and now is unable to provide any information.   REVIEW OF SYSTEMS: Unable to obtain secondary to the patient's medical condition and mental status.   PAST MEDICAL HISTORY: Seizure hypertension, diabetes and peripheral neuropathy.   SOCIAL HISTORY: Remote tobacco usage, none since December of 2014. States remote alcohol usage. No current alcohol. Denies any drug usage.   FAMILY HISTORY: Positive for diabetes, hypertension.   ALLERGIES: No known drug allergies.   HOME MEDICATIONS: Per documentation include ibuprofen 800 mg every 6 hours needed for pain, amitriptyline 100 mg p.o. daily, venlafaxine 75 mg p.o. b.i.d., allopurinol 100 mg p.o. daily, Lopressor 50 mg p.o. b.i.d.    PHYSICAL EXAMINATION: VITAL SIGNS: Temperature 98.6, heart rate 90, respirations 20, blood pressure 135/62, saturating 86% on room air. Currently saturating 92% on supplemental O2. Weight 102.1 kg. BMI 35.3.  GENERAL: Critically ill-appearing Caucasian gentleman in moderate distress given mental status, respiratory status.  HEAD: Normocephalic, atraumatic.  EYES: Pupils equal, round, and reactive to light. Unable to fully assess extraocular muscles. No scleral icterus.  MOUTH: Dry mucosal membranes. Dentition intact. No abscess noted. EARS, NOSE, AND THROAT: Clear without exudates. No external lesions.  NECK: Supple without thyromegaly. No nodules. No JVD.  LUNG: Clear to auscultation bilaterally with decreased breath sounds over the right. No use of accessory muscles, although currently on BiPAP therapy.  CHEST: Nontender to palpation.  HEART: S1 and S2, regular rate and rhythm. No murmurs, rubs or gallops. No edema. Pedal pulses 2+ bilaterally.  ABDOMEN: Soft, nontender, nondistended. No masses. Positive bowel sounds. No hepatosplenomegaly.  MUSCULOSKELETAL: No cyanosis, clubbing, or edema. Passive range of motion full at all extremities.  NEUROLOGIC: Unable to fully assess given the patient's current mental status and medical condition.  SKIN: No ulcerations, lesions, rashes, or cyanosis. He does have a diffuse redness without any actual rash. No midline cyanosis. Skin is warm and dry. Turgor is intact.  PSYCHIATRIC: Unable to fully assess given the patient's current mental status and medical condition. He is arousable to painful stimuli, although unable to provide meaningful information.   DIAGNOSTIC DATA: Sodium 137, potassium 5.6, chloride 103, bicarb 30, BUN 25, creatinine 2.12, glucose 127. Ammonia 60. LFTs within normal limits. WBC 9.7, hemoglobin 12.7, and platelets 216,000.   ABG performed, pH is 7.3, CO2 63, and pO2 43, which was performed  on room air. Carbon monoxide levels within  normal limits. Hemoglobin 1.9%.   CT head performed, no acute intracranial process.   Chest x-ray performed, no acute cardiopulmonary process.   ASSESSMENT AND PLAN: A 59 year old gentleman with history of hypertension and diabetes presenting with altered mental status.  1.  Acute encephalopathy of unclear etiology. It is multifactorial at this point was given elevated ammonia and CO2. For ammonia level give lactulose and titrate to 2 to 3 bowel movements daily.  2.  Acute respiratory failure with hypercapnia and hypoxia. Continue with BiPAP therapy and DuoNeb therapies q. 4 hours. We will check a d-dimer to have help determine the etiology. Check an ABG 30 to 60 minutes after the initiation of BiPAP therapy. If no improvement in pH or CO2,  may require intubation. This was discussed with the family.  3.  Acute kidney injury. IV fluid hydration. Will follow urine output and renal function.  4.  Hyperkalemia. We will give Kayexalate, insulin, and D50. Follow up potassium level.  5.  History of alcohol abuse, although denies recent alcohol. Initial CIWA protocol.  6.  Hypertension. Continue with metoprolol. 7.  Venous thromboembolism prophylaxis with heparin subcutaneous.   CODE STATUS: The patient is FULL code.   TIME SPENT: 55 minutes.  ____________________________ Aaron Mose. Hower, MD dkh:sb D: 03/12/2013 22:51:22 ET T: 03/13/2013 09:35:14 ET JOB#: [id number]  cc: Aaron Mose. Hower, MD, <Dictator> DAVID Woodfin Ganja MD ELECTRONICALLY SIGNED 03/13/2013 20:32

## 2014-05-05 NOTE — Consult Note (Signed)
PATIENT NAME:  [name], [name] MR#:  [id number] DATE OF BIRTH:  [birth year]  DATE OF CONSULTATION:  03/16/2013  REFERRING PHYSICIAN:  Dr. Bridgett Larsson.  CONSULTING PROVIDER:  Janalyn Harder. Jerelene Redden, ANP (Adult Nurse Practitioner) CONSULTING PHYSICIAN: Gaylyn Cheers, M.D.  REASON FOR CONSULTATION: Rectal bleeding.   HISTORY OF PRESENT ILLNESS: This 59 year old patient has a history of seizure disorder, hypertension, diabetes mellitus, and was brought to the Emergency Room on 03/12/2013 for mental status change. The patient is unable to give any history and the wife is not present to assist with this history of present illness.   According to the admission report, he had  a 3-day history of confusion that started out with minor symptoms and progressively worsened. He was reported to be conversant but agitated and increasingly confused in the ER. The patient was found to be positive for methadone on admission. Family unaware of reason.   The patient was seen by neurology today who has suggested that he probably has a metabolic encephalopathy with infection. The patient has also had a fever today of 104.2 axillary. He had an abnormal chest x-ray yesterday that did show some improvement today. He has been on antibiotic therapy. The patient has required intubation on admission and secondary to hypoxic and hypercapnic respiratory failure. This was acute with metabolic changes.   The patient was extubated yesterday. He was noted to have a diaper full of bright red blood this morning; therefore, GI was consulted. The patient has had no further rectal bleed events during the day. His hemoglobin on admission was 13.6 and remains normal today.   PAST MEDICAL HISTORY:  1.  Seizure disorder.  2.  Hypertension.  3.  Diabetes mellitus.  4.  Peripheral neuropathy.   PAST SURGICAL HISTORY: None listed.   HOME MEDICATIONS: See admission H and P. The patient is unable to communicate. Family members not available for  verification.   ALLERGIES: No known drug allergies.   HABITS: Remote tobacco use, discontinued December 14. There was report of remote alcohol use.   REVIEW OF SYSTEMS: GENERAL: Unable to be obtained as the patient is not responsive and thus groans, shouts out, follows no commands.   PHYSICAL EXAMINATION:  VITAL SIGNS: At 104.2, axillary. Heart rate 126, respirations 39, blood pressure 120/59.  GENERAL: Critically ill, disheveled, thrashing about in the bed.  HEENT: Sclerae are a little bloodshot. Conjunctivae pink. No icterus noted. The patient has ruddy complexion, sweating.  HEART: Heart tones S1, S2. Tachycardia noted.  RESPIRATORY: Lung sounds unable to be auscultated secondary to constant verbalization.  ABDOMEN: Protuberant, soft, unable to assess for bowel sounds.  RECTAL: Fresh medium-colored blood noted on the gloved finger. Could not palpate walls for presence or absence of mass. This was an anterior rectal exam evaluation.  EXTREMITIES: Lower extremities without edema.  SKIN: Clammy, somewhat sweaty, third spacing noticed. Arms are a little swollen and bruised.  MUSCULOSKELETAL:  Extremity movement x4. Follows no commands.  NEUROLOGIC: He is thrashing. Eyes are open and looking around the room but gaze is not focused and does not appear to be purposeful. He is constantly shouting, groaning, and there is cough.   LABORATORY, DIAGNOSTIC, AND RADIOLOGICAL DATA: Today with a glucose 16, creatinine 1.24. Electrolytes are normal. Ammonia level on admission was 60 down to 46.  Magnesium today 1.2.   CBC has been unremarkable and normal today including hemoglobin on admission of 12.7 to 13.6 and today 14.2.   Current ABG with pH 7.46, PO2 of 62, pCO2  of 31, base excess 36, bicarb is  -0.9 and O2 saturation is 95.5 on nasal cannula.   Blood culture and urine cultures are pending.  Admission urine positive for benzodiazepines and methadone. Alcohol is less than 3.   Chest x-ray,  single view, 03/15/2013, showed ET tube in correct placement. New lung base consolidation, mild right base atelectatic change.   Portal chest x-ray today shows improved aeration of the lung base compared to prior study.   CT of the head on admission showed no acute changes.   IMPRESSION: The patient presents with mental status change, febrile illness, and, after several days in the hospital, developed rectal bleeding.   Neurology opinion is probable metabolic encephalopathy with infection. The patient is unable to give history. Wife has been unavailable all day. The patient did present with mild elevation in ammonia level which would not be significantly high enough to continue to give him current symptoms. He has been receiving lactulose. Nursing staff reports only 1episode of rectal bleeding today. He could have rectal bleed from local irritation from hemorrhoids, other etiologies to be considered.   PLAN:  1.  Serial CBC, obtain coags for baseline, repeat ammonia level in the morning.  2.  Antibiotics are in process and lung fields have improved. I do not recommend luminal evaluation at this time.  3.  Recommend supportive care and close clinical monitoring. Luminal evluation when clinically feasible.  This case was discussed with Dr. Vira Agar in collaboration of care. Dr. Vira Agar to the bedside to evaluate the patient. Further GI recommendations pending the patient's progress over the next few days.  These services provided by Joelene Millin A. Jerelene Redden, MS, APRN, BC, ANP (Adult Nurse Practitioner) under collaborative agreement with Keith Rake, M.D.   ____________________________ Janalyn Harder. Jerelene Redden, ANP (Adult Nurse Practitioner) kam:np D: 03/16/2013 15:09:10 ET T: 03/16/2013 16:28:25 ET JOB#: [id number]  cc: Joelene Millin A. Jerelene Redden, ANP (Adult Nurse Practitioner), <Dictator> Janalyn Harder Sherlyn Hay, MSN, ANP-BC Adult Nurse Practitioner ELECTRONICALLY SIGNED 03/17/2013 14:32

## 2014-05-05 NOTE — Discharge Summary (Signed)
PATIENT NAME:  [name], COLAIZZI MR#:  [id number] DATE OF BIRTH:  05/29/1955  DATE OF ADMISSION:  03/12/2013 PRELIMINARY DATE OF DISCHARGE:  03/18/2013   ADMITTING DIAGNOSIS: Altered mental status of unclear etiology.   DISCHARGE DIAGNOSES:  1. Hepatic encephalopathy status post intubation on 03/13/2013 and extubation on 03/15/2013, reintubation on 03/18/2012.  2. Acute respiratory failure due to hepatic encephalopathy and altered mental status of unclear etiology, possibly drug abuse, including benzodiazepines as well as methadone found on urine drug screen.  3. Questionable aspiration pneumonitis.  4. Klebsiella pneumoniae septicemia with septic shock, with gram-negative rods in blood cultures on 03/16/2013.  5. Acute renal failure with creatinine level of 2.91 on 03/18/2013, with decreased urinary output.  6. Hypomagnesemia.  7. History of drug abuse. Urine drug screen positive for benzodiazepines as well as methadone. 8. History of seizure disorder.  9. Status post code blue on 03/18/2013 during intubation, requiring epinephrine as well as bicarbonate injections.  10. Suspected upper gastrointestinal bleed during intubation versus traumatic intubation and bleed into airways.  11. History of hypertension.  12. Diabetes mellitus with peripheral neuropathy.  13. Hepatic steatosis.  14. Constipation, relieved on 03/17/2013.  DISCHARGE CONDITION: Critical.   DISCHARGE MEDICATIONS: The patient is on:  1. Precedex drip.  2. Dextrose with bicarbonate at 100 mL/hour.  3. Octreotide drip.  4. Tylenol 350 mg every 4 hours as needed. 5. Amitriptyline 100 mg at bedtime.  6. Haloperidol 2 mg IV every 4 hours.  7. Heparin subcutaneously, suspended.  8. Lactulose 30 mL twice daily.  9. Metoprolol tartrate 50 mg twice daily, suspended.  10. Nitroglycerin 2% ointment, suspended.  11. Zofran 4 mg IV every 4 hours as needed.  12. Venlafaxine 75 mg twice daily.  13. Ativan 1 mg every 2 hours as  needed.  14. Sliding scale insulin.  15. Line flushes. 16. Zosyn 4.5 grams IV every 8 hours.  17. Geodon 10 mg intramuscular every 8 hours as needed.  18. Klonopin 0.25 mg orally every 8 hours.  19. Pantoprazole/sodium bicarbonate suspension 40 mg nasogastrically twice daily.  20. Solu-Medrol 60 mg IV every 6 hours.  21. Octreotide 50 mcg once on 03/18/2013, then continue 25 mcg/hour.  22. Oxygen per ET tube.  PROCEDURES: 1. Status post NG tube placement.  2. Status post left IJ placement.   CONSULTANTS:  1. Mariane Duval, MD, pulmonary critical care. 2. Leotis Pain, MD, neurology. 3. Cheral Marker. Ola Spurr, MD, ID. 4. Manya Silvas, MD, GI. 5. Gunnar Fusi, MD, anesthesiology.   RADIOLOGIC STUDIES:  1. Chest x-ray, PA and lateral, 03/12/2013, showed no active cardiopulmonary disease.  2. Repeated chest x-ray, portable, single view, 03/13/2013, revealed new ET tube in good position.  3. Portable single view chest x-ray, 03/15/2013, revealed tube position as described, without pneumothorax. New left base consolidation, mild right base atelectatic change.  4. Chest, portable, single view, 03/16/2013: Satisfactory central line placement. Improved aeration of lung bases as compared to prior study.  5. Abdomen, flat and erect, 03/17/2013, revealed moderate generalized bowel dilatation, major consideration includes ileus versus degree of obstruction. No free air noted in G-tube. Tip and side port are in the stomach.  6. Chest, portable, single view, 03/18/2013, showed enlarged central vascular structures and interstitial markings, findings suggest mild edema.  7. Repeated chest x-ray, 03/18/2013, post re-intubation revealed cardiomegaly with vascular congestion, question early interstitial edema, according to radiology.  8. CT scan of head without contrast, 03/12/2013, revealing no acute intracranial abnormality. 9. CT  scan of abdomen and pelvis without contrast, 03/17/2013,  revealing hepatic steatosis, prominent lymph nodes in the upper abdomen. Findings are nonspecific, but could be reactive in etiology. Patchy densities in the lung bases, likely represent atelectasis. Large amount of stool throughout the entire colon is suggestive for constipation.  10. Ultrasound of abdomen, general survey, 03/14/2013, showing the liver enlarged and demonstrating increased echotexture, which likely reflects fatty infiltration. No splenomegaly. The gallbladder was grossly normal. A sonographic Murphy sign could not be assessed due to the patient's unresponsive state. The common bile duct top-normal at 6.3 mm. Pancreas and kidneys and abdominal aorta exhibited no acute abnormalities were visualized.   HISTORY OF PRESENT ILLNESS: The patient is a 59 year old Caucasian male with past medical history significant for history of diabetes, hypertension, also history of hepatic encephalopathy, alcohol abuse in the past, who presented to the hospital with altered mental status. Apparently, the patient has been deteriorating over the past few days prior to coming to the hospital. He was getting confused and getting associated tremors, dropping things such as food or cups from his hands. No fevers or chills were reported, however, or other neurologic symptoms. He became increasingly confused and intubated on 03/13/2013. His lab data done on 03/12/2013 showed elevation of BUN and creatinine to 25 and 2.12, potassium 5.6, bicarbonate level of 30, otherwise BMP was unremarkable. The patient's ammonia level was 60. Alcohol level was less than 0.03. Liver enzymes were normal. Troponin was less than 0.02. Urine drug screen was positive for benzodiazepines as well as methadone. CBC: White blood cell count 9.7, hemoglobin 12.7, platelet count 216, absolute neutrophil count was 6.7, which was slightly elevated. D-dimer was slightly elevated at 0.47. Urinalysis was unremarkable. ABGs were performed on 21% FiO2  initially on arrival to the Emergency Room and showed pH of 7.30, pCO2 was 63, pO2 was 43, saturation was only 69.2%. The patient was continued on oxygen initially; however, deteriorated, and required intubation on 03/13/2013. He developed high fevers on 03/16/2013 already after extubation. His blood cultures where positive for gram-negative rods. The patient was continued on broad-spectrum antibiotic therapy initially; however, later on, when gram-negative rods came back, vancomycin was discontinued, and Zosyn was continued. The patient had urinalysis repeated, which was unremarkable except significant amount of red blood cells, 151, in the urinalysis were noted. Chest x-ray was remarkable for lung base atelectasis versus pneumonia. CT scan did not show any arthritis. It was felt that the patient's septicemia had been coming from pneumonia. Moreover, the patient's blood cultures came back just now as Klebsiella pneumoniae, sensitive to all antibiotics except resistant to ampicillin. Blood cultures repeated on 03/17/2013 did not show any growth. The patient, however, deteriorated and was having progressively more shortness of breath. His oxygenation worsened, and his ABGs also worsened. On 03/18/2013, he was intubated. During intubation, he actually had code blue because of PEA and asystole episode, requiring at least 5 mg of epinephrine as well as bicarbonate and CPR, and finally intubation by anesthesiologist due to significant difficulty intubating, also blood obscuring vision. It was felt that the patient's bleeding in upper airways was very likely due to traumatic intubation versus bleeding varices and clipped during intubation. Now, since intubation, he has remained somewhat tachycardic with heart rate of 108, temperature of 99.7, respiratory rate was 18, blood pressure 90s over 60s, O2 saturations were 93%, O2 saturations were difficult to get at this time peripherally, and repeated ABGs are pending. At this  time, the patient is intubated, relatively  stable, is somewhat hypotensive, but his mean arterial pressure is 69 at this time, and his heart rate in 100s. I discussed the patient's case with [facility], which is agreeable to take this patient and put him in a queue for medical ICU transfer. The patient will likely be transferred today or in the nearest future because of his ongoing medical problems.   TIME SPENT: 40 minutes.  ____________________________ Theodoro Grist, MD rv:lb D: 03/18/2013 09:53:46 ET T: 03/18/2013 11:38:58 ET JOB#: [id number]  cc: Theodoro Grist, MD, <Dictator> Nahia Nissan MD ELECTRONICALLY SIGNED 04/12/2013 21:38

## 2014-05-05 NOTE — Consult Note (Signed)
Brief Consult Note: Diagnosis: Rwctal bleeding.   Patient was seen by consultant.   Consult note dictated.  Electronic Signatures: Gershon Mussel (NP)  (Signed 05-Mar-15 14:36)  Authored: Brief Consult Note   Last Updated: 05-Mar-15 14:36 by Gershon Mussel (NP)

## 2014-05-05 NOTE — Consult Note (Signed)
Reason for Consult: Reason for Consult: altered mental status   History of Present Illness: History of Present Illness:   59 year old Caucasian gentleman with past medical history of seizure, hypertension, and diabetes presenting with altered mental status on 3/115.  Pt has been having 3 day duration of altered mental status, described mainly as confusion, which started out minor and now progressively worsened with associated tremors and dropping things, from food to cups. He has been confused about the events of the day. he believed he picked up her grandson; however, this was not true. Denies any recent focal neurological symptoms, fevers, chills. The patient was in his usual state of health prior to start up of these symptoms. On arrival to the Emergency Department noted be hypoxemic as well as altered. However, he was conversant. He appeared to be agitated and increasingly confused. admission pt was positive for methadone, family unawer of reason.    Past Medical/Surgical Hx:  Neuropathy:   Hypertension:   Seizures:   Diabetes:   Denies Surgical Hx:   Past Medical/ Surgical Hx:  Past Medical History Seizure hypertension, diabetes and peripheral neuropathy   Past Surgical History Remote tobacco usage, none since December of 2014. States remote alcohol usage. No current alcohol. Denies any drug usage   Home Medications: Medication Instructions Last Modified Date/Time  allopurinol 100 mg oral tablet 1 tab(s) orally once a day 04-Mar-15 14:53  metFORMIN 500 mg oral tablet 1 tab(s) orally once a day 04-Mar-15 14:53  carvedilol 25 mg oral tablet 1 tab(s) orally 2 times a day- every 12 hours 04-Mar-15 14:53  gabapentin 300 mg oral capsule 3 cap(s) orally 3 times a day 04-Mar-15 14:53  amLODIPine 10 mg oral tablet 1 tab(s) orally once a day 04-Mar-15 14:53  atorvastatin 20 mg oral tablet 4 tab(s) orally once a day (at bedtime) 04-Mar-15 14:53  amitriptyline 50 mg oral tablet 2 tab(s) orally  once a day (at bedtime) 04-Mar-15 14:53  Vitamin B-100 1 tab(s) orally 2 times a day 04-Mar-15 14:53  lisinopril 10 milligram(s) orally once a day 04-Mar-15 14:53  levETIRAcetam 1000 mg oral tablet 1 tab(s) orally 2 times a day 04-Mar-15 14:53  Super B Complex Vitamin B Complex oral tablet 1 tab(s) orally once a day 04-Mar-15 14:53   [facility] Current Meds:  Dextrose 5%-NaCl 0.45%, 1000 ml at 50 ml/hr  Dexmedetomidine Drip,  ( Precedex Drip ) 400 mcg /NS 100 ml (premix)  Concentration: (4 mcg/ml) - Final Volume: 100 mL, Dose Rate: 0.4 mcg/kg/hr at initial rate of 11.2 ml/hr  -Indication:sedation  Acetaminophen * tablet, ( Tylenol (325 mg) tablet)  650 mg Oral q4h PRN for pain or temp. greater than 100.4  - Indication: Pain/Fever  HePARin injection, 5000 unit(s), Subcutaneous, q8h  Indication: Anticoagulant, Monitor Anticoags per hospital protocol  Ondansetron injection, ( Zofran injection )  4 mg, IV push, q4h PRN for Nausea/Vomiting  Indication: Nausea/ Vomiting  Lactulose 10G/68m Syrup, ( Cephulac 10G/164msyrup )  30 ml Oral bid  -Indication:Constipation/ Encephalopathy  Instructions:  titrate to 2-3 bowel movement daily  Folic Acid tablet, ( Folate)  1 mg Oral daily  Stop After: 5 Days  - Indication: Folic Acid Deficiency  Haloperidol injection, ( Haldol injection )  2 mg, IV push, q4h PRN for hallucinations, paranoia, agitation not calmed by Ativan  Indication: Psychosis/ Delirium, for hallucinations, paranoia, agitation not calmed by Ativan.  Don't give for sedation only.  Magnesium Oxide tablet, ( Mag-Ox)  400 mg Oral daily  Stop  After: 5 Days  - Indication: Prevention of Magnesium Deficiency  Multivitamin tablet, ( Vitamin - Multiple)  1 tablet(s) Oral daily  Stop After: 5 Days  - Indication: Prevention and Treatment of Vitamin Deficiencies  Nitroglycerin 2% ointment, 1 inch(es) Topical q6h PRN for angina, hypertension  -Indication:Angina/ Hypertension  Instructions:   FOR SBP > 170 or DBP > 106 for Alcohol Detox orders  Amitriptyline tablet, 100 mg Oral at bedtime  - Indication: Major Depression  meTOProlol tartrate tablet, ( Lopressor)  50 mg Oral bid  - Indication: Antihypertensive/ Angina  Nursing Saline Flush, 3 to 6 ml, IV push, Q1M PRN for IV Maintenance  Venlafaxine tablet, 75 mg Oral bid  - Indication: Depression/ General Anxiety Disorder  Instructions:  Take with food.  LORazepam injection, ( Ativan injection )  1 mg, IV push, q2h PRN for anxiety, seizure, sedation.  Stop After:  48 Hours  Indication: Anxiety/ Seizure/ Antiemetic Adjunct/ Preop Sedation, for next 48 hours not to exceed 16 mg/24 hours per Alcohol Detox orders.  Thiamine tablet, ( Thiamine HCI)  50 mg Oral daily  Stop After: 3 Days  - Indication: Thiamine (B1) Deficiency  Instructions:  start after 177m Thiamine given for three days  Pantoprazole injection,  ( Protonix injection )  40 mg, IV push, q12h  Indication: Erosive Esophagitis/ GERD, one dose now, 1. Reconstitute Protonix with 149mof Normal Saline; concentration= 44m38mL. Once reconstituted; Protonix must be administered within 30 minutes.  2. I.V. line must be flushed before and after administration. Administer 47m28m0mg48mV. push over 2-3 minutes.  Magnesium Sulfate injection, 2 gram in Sterile Water 50 ml, IV Piggyback, once, Infuse over 4 hour(s)  Indication: Magnesium Deficiency, over 4 hours IF Magnesium is less than or equal to 1.7 per Electrolyte Replacement protocol.  MorphINE  injection, 2 to 4 mg, IV push, q2h PRN for pain  Indication: Pain, [Med Admin Window: 30 mins before or after scheduled dose]  Piperacillin-Tazobactam injection, ( Zosyn )  4.5 gram, IV Piggyback, q8h, Infuse over 4 hour(s)  Indication: Infection  Pharmacy dose per Zosyn Ext Inf Protocol, <<<Extended infusion protocol>>>  Line Flush - Normal Saline, 5 ml, IV push, daily  Indication: Line patency, Flush each UNUSED port with 5 ml  saline, 5 ml to each unused lumen daily  Line Flush - Normal Saline, 5 to 10 ml, IV push, ud PRN for line patency, Flush each lumen with 5 ml before and 10 ml after each med admin, TPN, blood draw or blood administration.  Line Flush Heparin 10 units/ml injection, 5 ml, IV push, ud PRN for line patency  Indication: Line Patency, Monitor Anticoags per hospital protocol, Flush lumen with 50 units after med admin, TPN, blood draws or blood administration.  Line Flush Heparin 10 units/ml injection, 5 ml, IV push, daily  Indication: Line Patency, Monitor Anticoags per hospital protocol, Flush each UNUSED port with 50 units Heparin every 24 hours., Flush each unused lumen with 50 units every 24 hours.  Vancomycin  injection,  ( Vancocin injection )  1500 mg in Dextrose 5% 300 ml, IV Piggyback, once, Infuse over 90 minute(s)  Indication: Infection, Do Not Reschedule  Vancomycin  injection,  ( Vancocin injection )  1500 mg in Dextrose 5% 300 ml, IV Piggyback, q12h, Infuse over 90 minute(s)  Indication: Infection, Patient started on vancomycin on 3/5.  Allergies:  No Known Allergies:   Vital Signs: **Vital Signs.:   05-Mar-15 11:00  Pulse Pulse 108  Respirations Respirations  43  Systolic BP Systolic BP 027  Diastolic BP (mmHg) Diastolic BP (mmHg) 51  Mean BP 69  Pulse Ox % Pulse Ox % 94  Pulse Ox Heart Rate 110   EXAM: Neuro: Pt groans to painful stimuli. Opens eyes to painful stimuli CNs pupils sluggish to react.  Positive corneals, cough, gag b/l flexion of upper and lower extremities to pain.  Lab Results:  Hepatic:  01-Mar-15 19:39   Bilirubin, Total 0.4  Alkaline Phosphatase 88 (45-117 NOTE: New Reference Range 12/02/12)  SGPT (ALT) 36  SGOT (AST) 29  Total Protein, Serum 7.8  Albumin, Serum 3.6  Routine Chem:  01-Mar-15 19:39   Glucose, Serum  127  BUN  25  Creatinine (comp)  2.12  Sodium, Serum 137  Potassium, Serum  5.6  Chloride, Serum 103  CO2, Serum 30   Calcium (Total), Serum 8.6  Anion Gap  4  Osmolality (calc) 280  eGFR (African American)  39  eGFR (Non-African American)  34 (eGFR values <54m/min/1.73 m2 may be an indication of chronic kidney disease (CKD). Calculated eGFR is useful in patients with stable renal function. The eGFR calculation will not be reliable in acutely ill patients when serum creatinine is changing rapidly. It is not useful in  patients on dialysis. The eGFR calculation may not be applicable to patients at the low and high extremes of body sizes, pregnant women, and vegetarians.)  Ethanol, S. < 3  Ethanol % (comp) < 0.003 (Result(s) reported on 12 Mar 2013 at 08:46PM.)    20:56   Ammonia, Plasma  60  Result Comment AMMONIA - Slight hemolysis, interpret results with  - caution.  Result(s) reported on 12 Mar 2013 at 09:33PM.    21:25   Result Comment - pt on room air with spo2 80.  - placed pt on 3l [place]  Result(s) reported on 12 Mar 2013 at 09:34PM.    22:50   Result Comment - bipap 12/8 rr12 50%  - HAND DELIVERED  - Dr HLavetta Nielsenin ed 2315 03/12/13  Result(s) reported on 12 Mar 2013 at 11:11PM.  03-Mar-15 04:27   Magnesium, Serum  1.7 (1.8-2.4 THERAPEUTIC RANGE: 4-7 mg/dL TOXIC: > 10 mg/dL  -----------------------)  Glucose, Serum  123  BUN 9  Creatinine (comp) 0.91  Sodium, Serum 137  Potassium, Serum 4.7  Chloride, Serum 105  CO2, Serum 29  Calcium (Total), Serum  8.2  Anion Gap  3  Osmolality (calc) 274  eGFR (African American) >60  eGFR (Non-African American) >60 (eGFR values <663mmin/1.73 m2 may be an indication of chronic kidney disease (CKD). Calculated eGFR is useful in patients with stable renal function. The eGFR calculation will not be reliable in acutely ill patients when serum creatinine is changing rapidly. It is not useful in  patients on dialysis. The eGFR calculation may not be applicable to patients at the low and high extremes of body sizes, pregnant women, and vegetarians.)   Phosphorus, Serum 3.0 (Result(s) reported on 14 Mar 2013 at 11:20AM.)  Ammonia, Plasma  46 (Result(s) reported on 14 Mar 2013 at 05:13AM.)  04-Mar-15 04:08   Magnesium, Serum 1.9 (1.8-2.4 THERAPEUTIC RANGE: 4-7 mg/dL TOXIC: > 10 mg/dL  -----------------------)  05-Mar-15 09:16   Magnesium, Serum  1.2 (1.8-2.4 THERAPEUTIC RANGE: 4-7 mg/dL TOXIC: > 10 mg/dL  -----------------------)  Glucose, Serum  103  BUN 16  Creatinine (comp) 1.24  Sodium, Serum 138  Potassium, Serum 3.9  Chloride, Serum 104  CO2, Serum 28  Calcium (Total), Serum  8.7  Anion Gap  6  Osmolality (calc) 277  eGFR (African American) >60  eGFR (Non-African American) >60 (eGFR values <82m/min/1.73 m2 may be an indication of chronic kidney disease (CKD). Calculated eGFR is useful in patients with stable renal function. The eGFR calculation will not be reliable in acutely ill patients when serum creatinine is changing rapidly. It is not useful in  patients on dialysis. The eGFR calculation may not be applicable to patients at the low and high extremes of body sizes, pregnant women, and vegetarians.)  Urine Drugs:  [id number]  Tricyclic Antidepressant, Ur Qual (comp) NEGATIVE (Result(s) reported on 13 Mar 2013 at 01:22AM.)  Amphetamines, Urine Qual. NEGATIVE  MDMA, Urine Qual. NEGATIVE  Cocaine Metabolite, Urine Qual. NEGATIVE  Opiate, Urine qual NEGATIVE  Phencyclidine, Urine Qual. NEGATIVE  Cannabinoid, Urine Qual. NEGATIVE  Barbiturates, Urine Qual. NEGATIVE  Benzodiazepine, Urine Qual. POSITIVE (----------------- The URINE DRUG SCREEN provides only a preliminary, unconfirmed analytical test result and should not be used for non-medical  purposes.  Clinical consideration and professional judgment should be  applied to any positive drug screen result due to possible interfering substances.  A more specific alternate chemical method must be used in order to obtain a confirmed analytical result.   Gas chromatography/mass spectrometry (GC/MS) is the preferred confirmatory method.)  Methadone, Urine Qual. POSITIVE  Cardiac:  01-Mar-15 19:39   Troponin I < 0.02 (0.00-0.05 0.05 ng/mL or less: NEGATIVE  Repeat testing in 3-6 hrs  if clinically indicated. >0.05 ng/mL: POTENTIAL  MYOCARDIAL INJURY. Repeat  testing in 3-6 hrs if  clinically indicated. NOTE: An increase or decrease  of 30% or more on serial  testing suggests a  clinically important change)  Routine UA:  02-Mar-15 00:44   Color (UA) Amber  Clarity (UA) Cloudy  Glucose (UA) Negative  Bilirubin (UA) Negative  Ketones (UA) Negative  Specific Gravity (UA) 1.013  Blood (UA) Negative  pH (UA) 5.0  Protein (UA) Negative  Nitrite (UA) Negative  Leukocyte Esterase (UA) Negative (Result(s) reported on 13 Mar 2013 at 01:12AM.)  RBC (UA) NONE SEEN  WBC (UA) <1 /HPF  Bacteria (UA) NONE SEEN  Epithelial Cells (UA) <1 /HPF  Mucous (UA) PRESENT  Hyaline Cast (UA) 13 /LPF (Result(s) reported on 13 Mar 2013 at 01:12AM.)  05-Mar-15 10:54   Color (UA) Amber  Clarity (UA) Hazy  Glucose (UA) Negative  Bilirubin (UA) Negative  Ketones (UA) Negative  Specific Gravity (UA) 1.014  Blood (UA) 2+  pH (UA) 5.0  Protein (UA) 30 mg/dL  Nitrite (UA) Negative  Leukocyte Esterase (UA) Negative (Result(s) reported on 16 Mar 2013 at 11:11AM.)  RBC (UA) 151 /HPF  WBC (UA) NONE SEEN  Bacteria (UA) NONE SEEN  Epithelial Cells (UA) NONE SEEN  Mucous (UA) PRESENT  Hyaline Cast (UA) 1 /LPF (Result(s) reported on 16 Mar 2013 at 11:11AM.)  Routine Coag:  01-Mar-15 19:39   D-Dimer, Quantitative  0.47 ("If the D-dimer test is being used to assist in the exclusion of DVT and/or PE, note the following:  In various studies concerning the D-dimer methodology (STA Liatest) in use by this laboratory, it has been reported that with a cut-off value of 0.50 ug/mL FEU, the  negative predictive value regarding the exclusion of thrombosis is within  the 95-100% range."  In patients with high pre-test probability of DVT/PE the results of the D-dimer test should be correlated with other diagnostic and clinical assessment modalities. Reference: [place], 2005.)  Routine Hem:  01-Mar-15 19:39   WBC (CBC) 9.7  RBC (CBC)  4.38  Hemoglobin (CBC)  12.7  Hematocrit (CBC)  38.2  Platelet Count (CBC) 216  MCV 87  MCH 28.9  MCHC 33.2  RDW  15.6  Neutrophil % 68.7  Lymphocyte % 19.6  Monocyte % 7.2  Eosinophil % 3.8  Basophil % 0.7  Neutrophil #  6.7  Lymphocyte # 1.9  Monocyte # 0.7  Eosinophil # 0.4  Basophil # 0.1 (Result(s) reported on 12 Mar 2013 at 07:56PM.)  03-Mar-15 13:54   WBC (CBC) 8.4  RBC (CBC) 4.56  Hemoglobin (CBC) 13.6  Hematocrit (CBC)  39.6  Platelet Count (CBC) 190  MCV 87  MCH 29.9  MCHC 34.3  RDW  15.2  Neutrophil % 69.7  Lymphocyte % 13.9  Monocyte % 9.4  Eosinophil % 5.9  Basophil % 1.1  Neutrophil # 5.9  Lymphocyte # 1.2  Monocyte # 0.8  Eosinophil # 0.5  Basophil # 0.1 (Result(s) reported on 14 Mar 2013 at 02:07PM.)  05-Mar-15 09:16   WBC (CBC) 4.4  RBC (CBC) 4.78  Hemoglobin (CBC) 14.2  Hematocrit (CBC) 41.4  Platelet Count (CBC) 192  MCV 87  MCH 29.8  MCHC 34.4  RDW  14.6  Neutrophil % 76.4  Lymphocyte % 16.4  Monocyte % 0.5  Eosinophil % 6.5  Basophil % 0.2  Neutrophil # 3.4  Lymphocyte #  0.7  Monocyte #  0.0  Eosinophil # 0.3  Basophil # 0.0 (Result(s) reported on 16 Mar 2013 at 10:19AM.)   Radiology Results: CT:    01-Mar-15 20:26, CT Head Without Contrast  CT Head Without Contrast   REASON FOR EXAM:    confusion; "not remembering things" x 3 days  COMMENTS:       PROCEDURE: CT  - CT HEAD WITHOUT CONTRAST  - Mar 12 2013  8:26PM     CLINICAL DATA:  Patient having difficulty remembering. History of  bladder carcinoma.    EXAM:  CT HEAD WITHOUT CONTRAST    TECHNIQUE:  Contiguous axial images were obtained from the base of the skull  through the  vertex without intravenous contrast.  COMPARISON:  05/03/2012    FINDINGS:  Ventricles are normal in size and configuration. No parenchymal  masses or mass effect. No areas of abnormal parenchymal attenuation.  No evidence of a cortical infarct.    There are no extra-axial masses or abnormal fluid collections.    No intracranial hemorrhage.    Mild anterior ethmoid sinus and inferior frontal sinus mucosal  thickening. Remaining visualized sinuses and the mastoid air cells  are clear.   IMPRESSION:  1. No intracranial abnormality.      Electronically Signed    By: Lajean Manes M.D.    On: 03/12/2013 20:30         Verified By: Lasandra Beech, M.D.,   Impression/Recommendations: Recommendations:   59 year old Caucasian gentleman with past medical history of seizure, hypertension, and diabetes presenting with altered mental status.  metabolic encephalopathy with infection. Not focal neurological exam.    Restart home Keppra 1g q12. As per notes pt has hx of seizures. Will obtain EEG today.con't precedexWould start clonazapem small dose at .25 q8hrs and possibly increase to .5 q8 as neededIncrease thiamine to 500 daily. agree with antibiotics as febrile and fever of 104.2Magnesium suplementation as Mg was 1.2  Addendum: EEG suspicion of triphasic waves which is consistent with metabolic enchepalopathy.    Electronic Signatures: Leotis Pain (MD)  (  Signed 05-Mar-15 14:06)  Authored: Consult, History of Present Illness, PAST MEDICAL/SURGICAL HISTORY, HOME MEDICATIONS, Current Medications, ALLERGIES, NURSING VITAL SIGNS, Physical Exam-, LAB RESULTS, RADIOLOGY RESULTS, Recommendations   Last Updated: 05-Mar-15 14:06 by Leotis Pain (MD)

## 2014-05-05 NOTE — Consult Note (Signed)
Brief Consult Note: Diagnosis: Respiratory Failure.   Comments: Called to see pt. secondary to failure to intubate. Pt in cardiac arrest. CPR being performed. Pt. had heme at the oropharynx. After suctioning, using Glydescopre, cords visualized. ETT passed. Positive ETCO2. Tube passed to Respiratory Tx for securing.  Electronic Signatures: Gunnar Fusi (MD)  (Signed 07-Mar-15 08:42)  Authored: Brief Consult Note   Last Updated: 07-Mar-15 08:42 by Gunnar Fusi (MD)

## 2014-05-05 NOTE — Consult Note (Signed)
Pt with GNR sepsis, altered mental status, I wonder about meningitis of unknown origin.  Abd with distention, decreased bowel sounds, will get CT of abdomen with oral contrast, creatinine climbing so will not give IV contrast. Dr. Rayann Heman on this weekend.  No further bleeding.  Electronic Signatures: Manya Silvas (MD)  (Signed on 06-Mar-15 18:10)  Authored  Last Updated: 06-Mar-15 18:10 by Manya Silvas (MD)

## 2015-02-11 ENCOUNTER — Encounter: Payer: Self-pay | Admitting: Internal Medicine

## 2015-03-18 IMAGING — CT CT HEAD WITHOUT CONTRAST
1 series · 16 of 30 positions shown, 20 images · non-contrast
Comparison: 05/03/2012

CLINICAL DATA: Patient having difficulty remembering. History of
bladder carcinoma.

EXAM:
CT HEAD WITHOUT CONTRAST
TECHNIQUE: Contiguous axial images were obtained from the base of the skull
through the vertex without intravenous contrast.

[Series 2: head wo · axial · 0.43mm/px · z∈[-133,-7]mm · 16 of 32 slices shown, 20 images]
[im 2/32  brain]
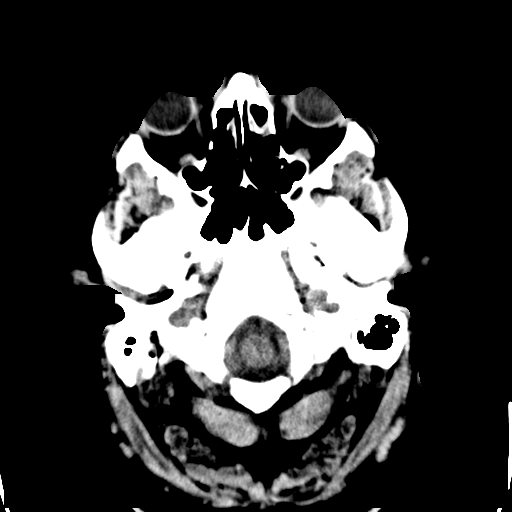
[im 2/32  bone]
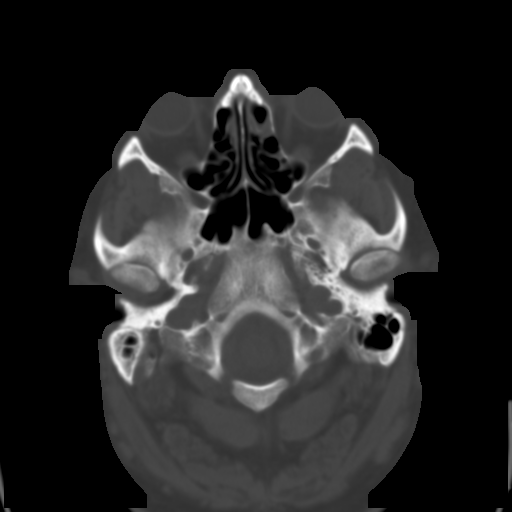
[im 4/32  brain]
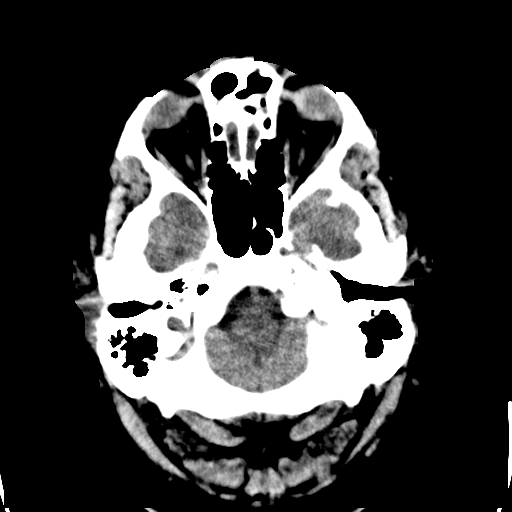
[im 6/32  brain]
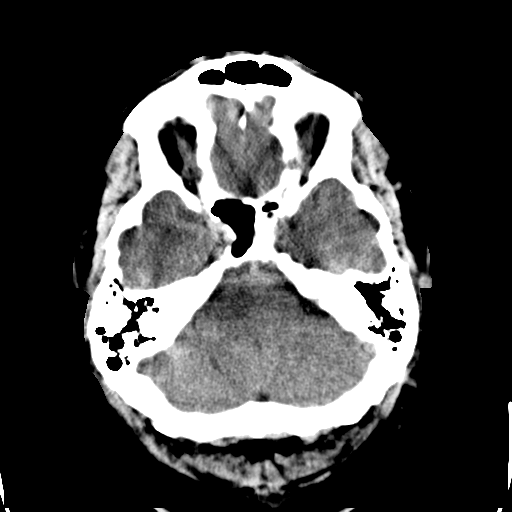
[im 8/32  brain]
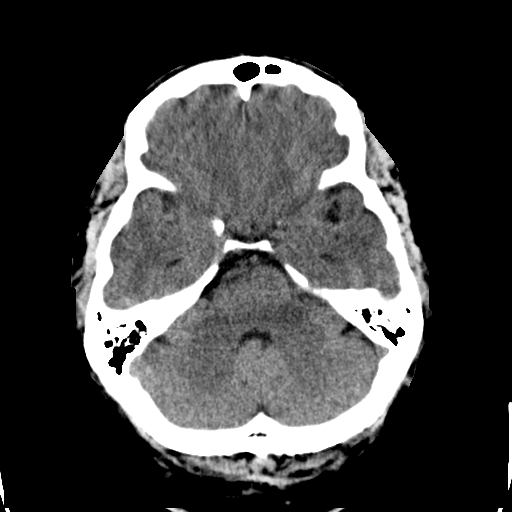
[im 9/32  brain]
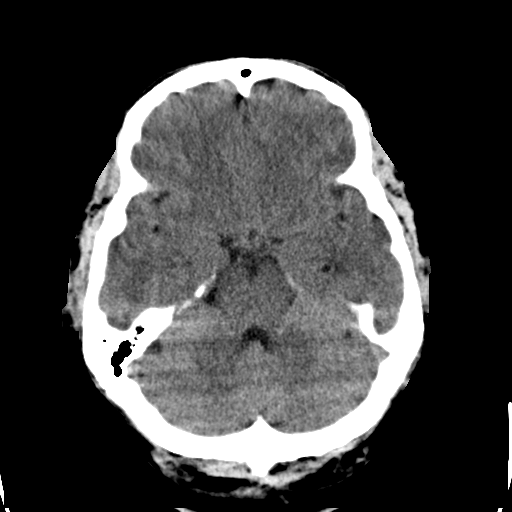
[im 9/32  bone]
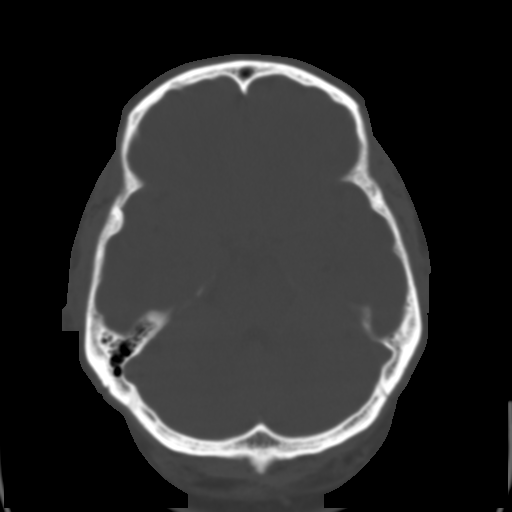
[im 11/32  brain]
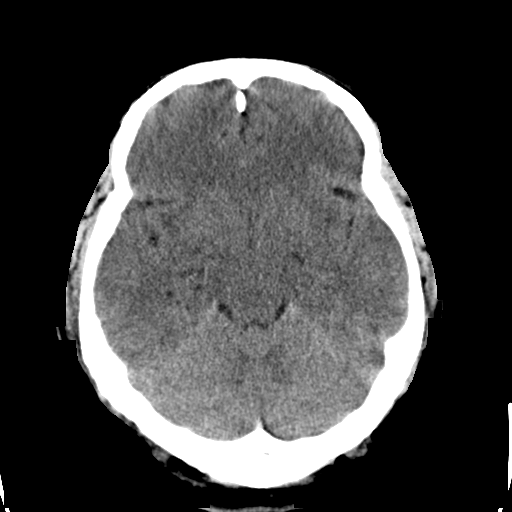
[im 13/32  brain]
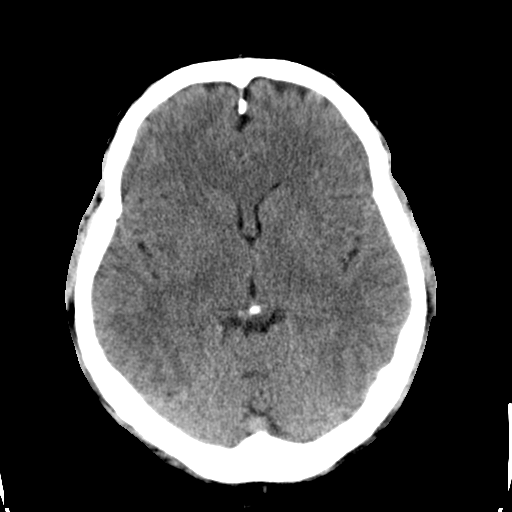
[im 15/32  brain]
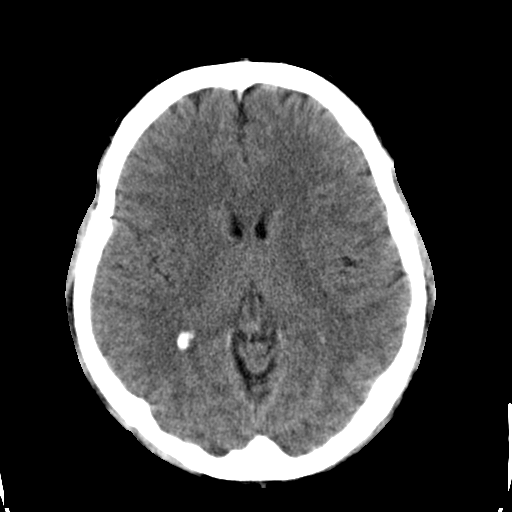
[im 17/32  brain]
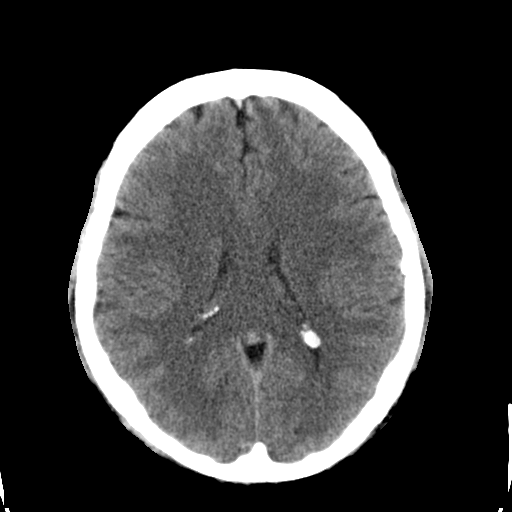
[im 17/32  bone]
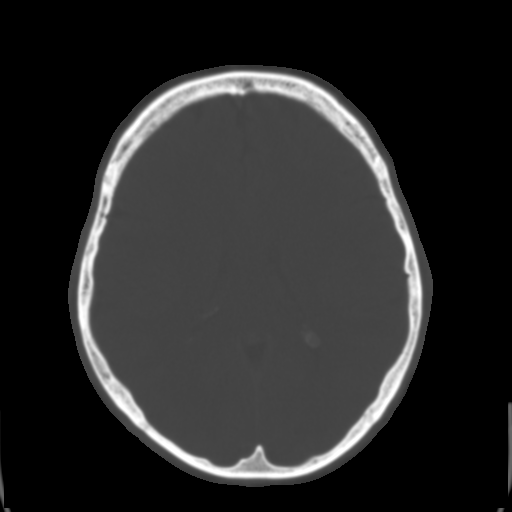
[im 19/32  brain]
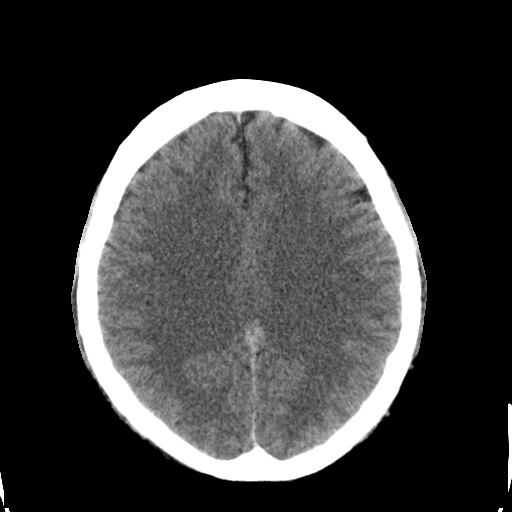
[im 21/32  brain]
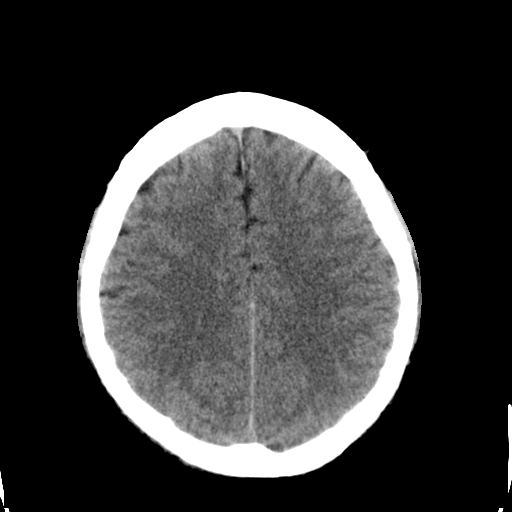
[im 23/32  brain]
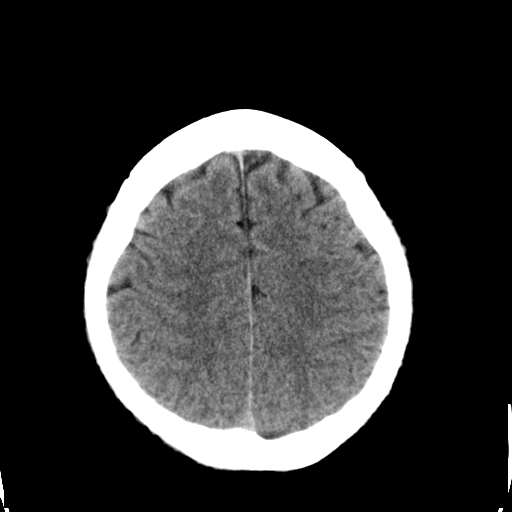
[im 24/32  brain]
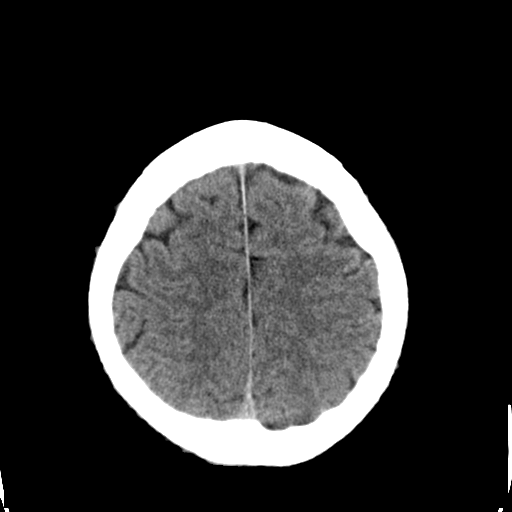
[im 24/32  bone]
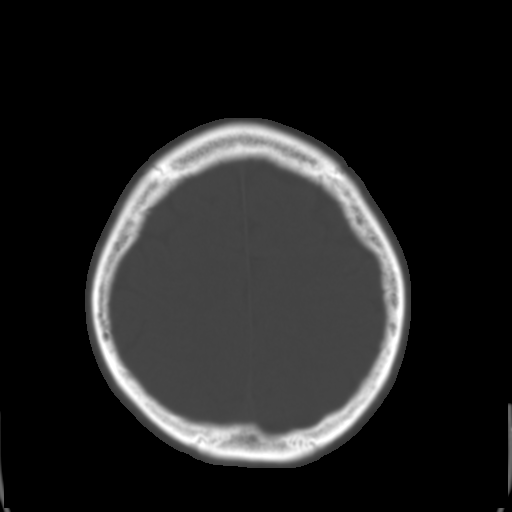
[im 26/32  brain]
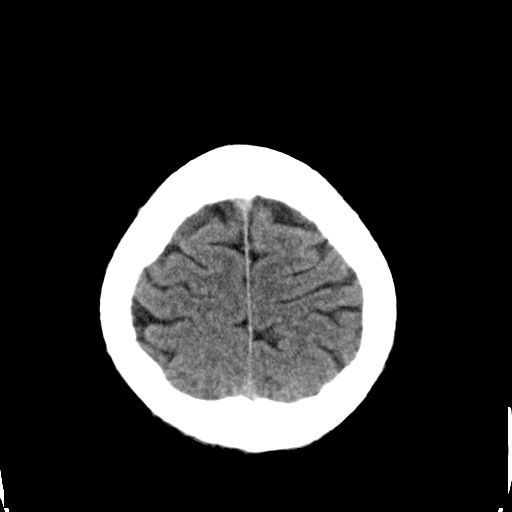
[im 28/32  brain]
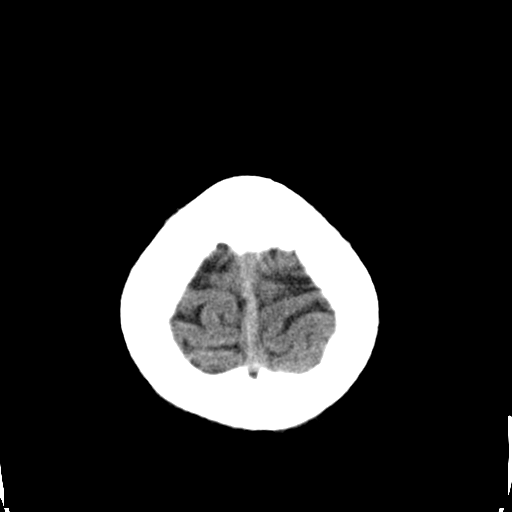
[im 30/32  brain]
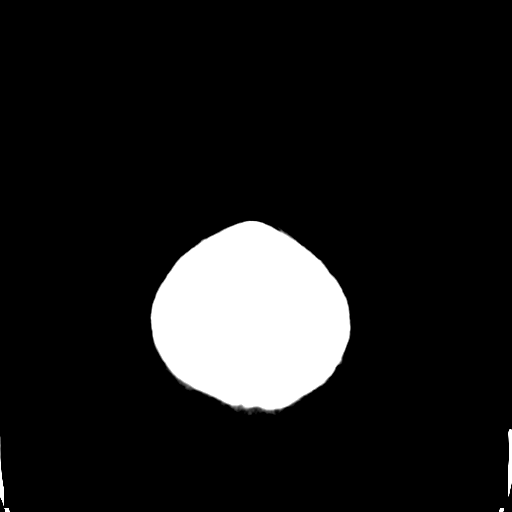

[16 of 30 positions shown; findings below may reference images not displayed]

FINDINGS: Ventricles are normal in size and configuration. No parenchymal
masses or mass effect. No areas of abnormal parenchymal attenuation.
No evidence of a cortical infarct.

There are no extra-axial masses or abnormal fluid collections.

No intracranial hemorrhage.

Mild anterior ethmoid sinus and inferior frontal sinus mucosal
thickening. Remaining visualized sinuses and the mastoid air cells
are clear.
IMPRESSION: 1. No intracranial abnormality.

## 2015-03-22 IMAGING — CR DG CHEST 1V PORT
1 series · 1 of 1 positions shown · non-contrast
Comparison: 03/15/2013

CLINICAL DATA: Central line placement

EXAM:
PORTABLE CHEST - 1 VIEW

[ap]
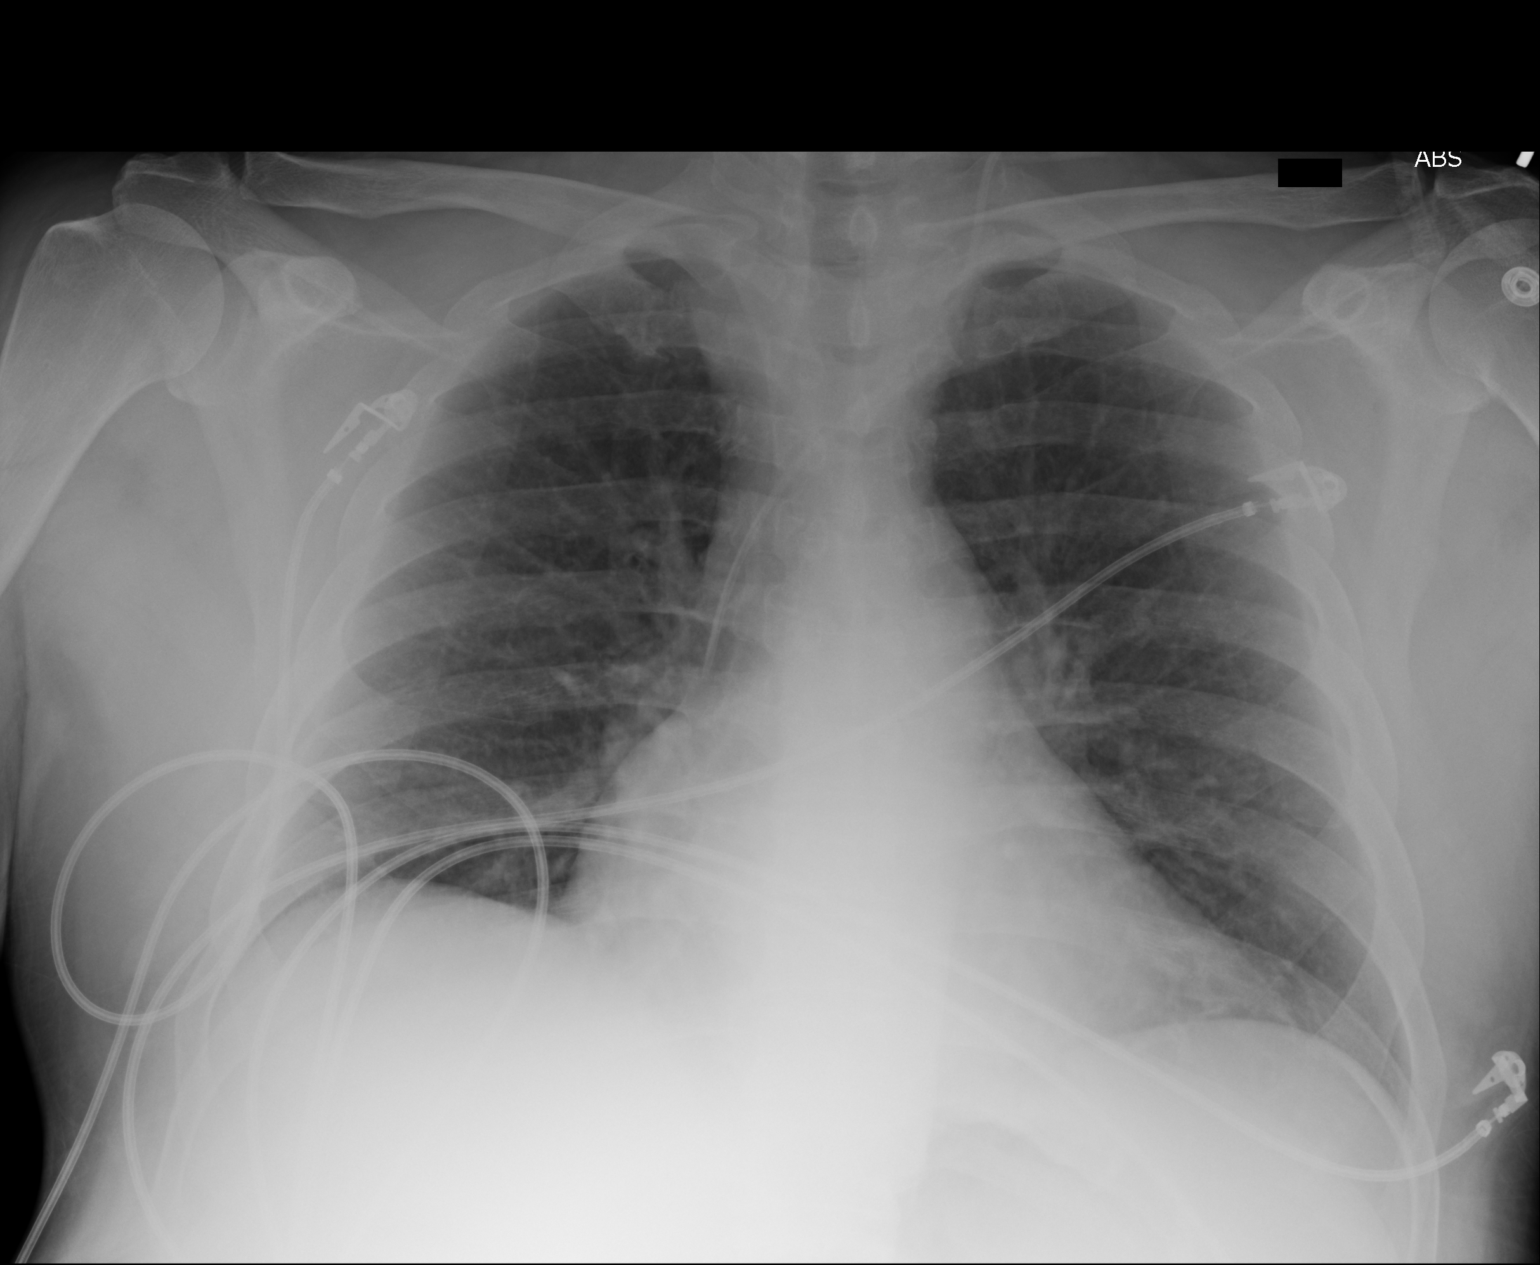

[1 of 1 positions shown; findings below may reference images not displayed]

FINDINGS: Endotracheal tube removed.  NG removed.

Left jugular central venous catheter tip in the SVC. No
pneumothorax.

The lungs are clear with improved aeration in the lung bases.
IMPRESSION: Satisfactory central line placement.

Improved aeration in the lung bases compared with the prior study.
